# Patient Record
Sex: Male | Born: 1965 | Race: Asian | Hispanic: No | Marital: Married | Smoking: Never smoker
Health system: Southern US, Community
[De-identification: ages and names within clinical notes are randomized; demographics above are authoritative.]

## PROBLEM LIST (undated history)

## (undated) DIAGNOSIS — K769 Liver disease, unspecified: Secondary | ICD-10-CM

## (undated) HISTORY — PX: LIVER SURGERY: SHX698

---

## 2005-05-24 ENCOUNTER — Encounter: Admission: RE | Admit: 2005-05-24 | Discharge: 2005-08-22 | Payer: Self-pay | Admitting: Internal Medicine

## 2006-01-29 ENCOUNTER — Encounter: Admission: RE | Admit: 2006-01-29 | Discharge: 2006-01-29 | Payer: Self-pay | Admitting: Internal Medicine

## 2007-09-28 ENCOUNTER — Emergency Department (HOSPITAL_COMMUNITY): Admission: EM | Admit: 2007-09-28 | Discharge: 2007-09-29 | Payer: Self-pay | Admitting: Emergency Medicine

## 2007-10-31 ENCOUNTER — Ambulatory Visit: Payer: Self-pay | Admitting: Family Medicine

## 2007-11-26 ENCOUNTER — Ambulatory Visit: Payer: Self-pay | Admitting: Internal Medicine

## 2007-11-26 LAB — CONVERTED CEMR LAB
AST: 21 units/L (ref 0–37)
Albumin: 4.5 g/dL (ref 3.5–5.2)
Alkaline Phosphatase: 135 units/L — ABNORMAL HIGH (ref 39–117)
CO2: 22 meq/L (ref 19–32)
Calcium, Total (PTH): 9.3 mg/dL (ref 8.4–10.5)
Creatinine, Ser: 0.99 mg/dL (ref 0.40–1.50)
Eosinophils Relative: 4 % (ref 0–5)
Glucose, Bld: 110 mg/dL — ABNORMAL HIGH (ref 70–99)
HCT: 47.7 % (ref 39.0–52.0)
Lymphocytes Relative: 34 % (ref 12–46)
Lymphs Abs: 2.2 10*3/uL (ref 0.7–4.0)
MCHC: 34 g/dL (ref 30.0–36.0)
MCV: 87.2 fL (ref 78.0–100.0)
Monocytes Absolute: 0.6 10*3/uL (ref 0.1–1.0)
Neutro Abs: 3.4 10*3/uL (ref 1.7–7.7)
Platelets: 200 10*3/uL (ref 150–400)
RBC: 5.47 M/uL (ref 4.22–5.81)
RDW: 14.8 % (ref 11.5–15.5)
Sodium: 141 meq/L (ref 135–145)
TSH: 2.804 microintl units/mL (ref 0.350–5.50)
Total Protein: 7.6 g/dL (ref 6.0–8.3)
WBC: 6.6 10*3/uL (ref 4.0–10.5)

## 2007-12-19 ENCOUNTER — Ambulatory Visit: Payer: Self-pay | Admitting: Family Medicine

## 2007-12-22 ENCOUNTER — Ambulatory Visit: Payer: Self-pay | Admitting: *Deleted

## 2008-10-20 ENCOUNTER — Ambulatory Visit: Payer: Self-pay | Admitting: Family Medicine

## 2009-05-20 ENCOUNTER — Ambulatory Visit: Payer: Self-pay | Admitting: Internal Medicine

## 2011-07-13 LAB — BASIC METABOLIC PANEL
Calcium: 9.3
Potassium: 3.5

## 2011-07-13 LAB — URINALYSIS, ROUTINE W REFLEX MICROSCOPIC
Bilirubin Urine: NEGATIVE
Glucose, UA: NEGATIVE
Ketones, ur: 15 — AB
Protein, ur: NEGATIVE

## 2011-07-13 LAB — DIFFERENTIAL
Eosinophils Relative: 0
Lymphocytes Relative: 9 — ABNORMAL LOW
Neutro Abs: 11.6 — ABNORMAL HIGH

## 2011-07-13 LAB — URINE MICROSCOPIC-ADD ON

## 2011-07-13 LAB — CBC
HCT: 43.6
Hemoglobin: 15
MCV: 84.7
Platelets: 230

## 2014-11-08 ENCOUNTER — Emergency Department (HOSPITAL_BASED_OUTPATIENT_CLINIC_OR_DEPARTMENT_OTHER)
Admission: EM | Admit: 2014-11-08 | Discharge: 2014-11-08 | Disposition: A | Discharge status: Home / Self Care | Payer: Medicaid Other | Attending: Emergency Medicine | Admitting: Emergency Medicine

## 2014-11-08 ENCOUNTER — Emergency Department (HOSPITAL_BASED_OUTPATIENT_CLINIC_OR_DEPARTMENT_OTHER): Payer: Medicaid Other

## 2014-11-08 ENCOUNTER — Encounter (HOSPITAL_BASED_OUTPATIENT_CLINIC_OR_DEPARTMENT_OTHER): Payer: Self-pay | Admitting: *Deleted

## 2014-11-08 DIAGNOSIS — R1084 Generalized abdominal pain: Secondary | ICD-10-CM | POA: Insufficient documentation

## 2014-11-08 DIAGNOSIS — R112 Nausea with vomiting, unspecified: Secondary | ICD-10-CM | POA: Insufficient documentation

## 2014-11-08 DIAGNOSIS — R079 Chest pain, unspecified: Secondary | ICD-10-CM | POA: Diagnosis present

## 2014-11-08 DIAGNOSIS — Z880 Allergy status to penicillin: Secondary | ICD-10-CM | POA: Insufficient documentation

## 2014-11-08 DIAGNOSIS — Z8719 Personal history of other diseases of the digestive system: Secondary | ICD-10-CM | POA: Insufficient documentation

## 2014-11-08 DIAGNOSIS — R5383 Other fatigue: Secondary | ICD-10-CM | POA: Insufficient documentation

## 2014-11-08 HISTORY — DX: Liver disease, unspecified: K76.9

## 2014-11-08 LAB — COMPREHENSIVE METABOLIC PANEL
ALK PHOS: 225 U/L — AB (ref 39–117)
ALT: 40 U/L (ref 0–53)
AST: 33 U/L (ref 0–37)
Albumin: 3.3 g/dL — ABNORMAL LOW (ref 3.5–5.2)
Anion gap: 4 — ABNORMAL LOW (ref 5–15)
BILIRUBIN TOTAL: 2.4 mg/dL — AB (ref 0.3–1.2)
BUN: 16 mg/dL (ref 6–23)
CHLORIDE: 109 mmol/L (ref 96–112)
CO2: 25 mmol/L (ref 19–32)
Calcium: 8.7 mg/dL (ref 8.4–10.5)
Creatinine, Ser: 0.87 mg/dL (ref 0.50–1.35)
GFR calc Af Amer: >90 mL/min (ref >90)
Glucose, Bld: 92 mg/dL (ref 70–99)
Potassium: 3.3 mmol/L — ABNORMAL LOW (ref 3.5–5.1)
Sodium: 138 mmol/L (ref 135–145)
Total Protein: 8 g/dL (ref 6.0–8.3)

## 2014-11-08 LAB — CBC WITH DIFFERENTIAL/PLATELET
BASOS ABS: 0.1 10*3/uL (ref 0.0–0.1)
Basophils Relative: 1 % (ref 0–1)
EOS ABS: 0.3 10*3/uL (ref 0.0–0.7)
EOS PCT: 2 % (ref 0–5)
HEMATOCRIT: 39.5 % (ref 39.0–52.0)
HEMOGLOBIN: 13 g/dL (ref 13.0–17.0)
LYMPHS PCT: 18 % (ref 12–46)
Lymphs Abs: 2 10*3/uL (ref 0.7–4.0)
MCH: 28.6 pg (ref 26.0–34.0)
MCHC: 32.9 g/dL (ref 30.0–36.0)
MCV: 86.8 fL (ref 78.0–100.0)
Monocytes Absolute: 1.3 10*3/uL — ABNORMAL HIGH (ref 0.1–1.0)
Monocytes Relative: 12 % (ref 3–12)
NEUTROS ABS: 7.7 10*3/uL (ref 1.7–7.7)
Neutrophils Relative %: 67 % (ref 43–77)
Platelets: 396 10*3/uL (ref 150–400)
RBC: 4.55 MIL/uL (ref 4.22–5.81)
RDW: 13.9 % (ref 11.5–15.5)
WBC: 11.4 10*3/uL — ABNORMAL HIGH (ref 4.0–10.5)

## 2014-11-08 LAB — URINALYSIS, ROUTINE W REFLEX MICROSCOPIC
GLUCOSE, UA: NEGATIVE mg/dL
HGB URINE DIPSTICK: NEGATIVE
Ketones, ur: NEGATIVE mg/dL
Leukocytes, UA: NEGATIVE
Nitrite: NEGATIVE
Protein, ur: NEGATIVE mg/dL
Specific Gravity, Urine: 1.013 (ref 1.005–1.030)
UROBILINOGEN UA: 0.2 mg/dL (ref 0.0–1.0)
pH: 5.5 (ref 5.0–8.0)

## 2014-11-08 LAB — TROPONIN I: Troponin I: <0.03 ng/mL (ref <0.031)

## 2014-11-08 LAB — LIPASE, BLOOD: LIPASE: 40 U/L (ref 11–59)

## 2014-11-08 MED ORDER — OMEPRAZOLE 20 MG PO CPDR: 20.0000 mg | DELAYED_RELEASE_CAPSULE | Freq: Every day | ORAL | Status: DC

## 2014-11-08 MED ORDER — SUCRALFATE 1 G PO TABS: 1.0000 g | ORAL_TABLET | Freq: Three times a day (TID) | ORAL | Status: DC

## 2014-11-08 NOTE — ED Notes (Signed)
Pt c/o abd pain yesterday and today started having generalized chest pain and bilateral shoulder pain  Increased w inspiration, denies cough, no fever, no n/v  Chills at times

## 2014-11-08 NOTE — ED Provider Notes (Signed)
CSN: [id number]     Arrival date & time 11/08/14  1835 History   First MD Initiated Contact with Patient 11/08/14 1930     Chief Complaint  Patient presents with  . Chest Pain     (Consider location/radiation/quality/duration/timing/severity/associated sxs/prior Treatment) HPI Comments: Patient with history of narrow bile ducts, status post bile duct stenting -- presents with complaint of upper abdominal pain with radiation of achiness and soreness into his chest, shoulders, and upper extremities bilaterally. It is not reproducible and does not have any association to food or activity. Patient states he has had similar symptoms to this 3-4 times over the past 10 years. Each time he took over-the-counter medications and rested and the symptoms resolved.   Patient had nausea, vomiting, and fever 3 weeks ago while visiting [place]. He was told that he had an infection by urine/blood testing but is no more specific than this. He was seen by a doctor and prescribed an antibiotic for 7 days which he took and his symptoms improved. Patient returned to the [place] 2 weeks ago. He continued to have anorexia and generalized fatigue but was feeling better overall. Yesterday he developed upper abdominal soreness without nausea or vomiting. No diarrhea. Today this pain and soreness moved up to chest as described above. He does not have cough or shortness of breath. No hemoptysis. No history of blood clot. He does not have any swelling or soreness in his lower extremities. Patient did have a long plane flight 2 weeks ago when he returned home. No treatments PTA.   Patient does say that he has had episodes of "liver infection" in the past with associated elevated bilirubin and upper quadrant tenderness. Patient states that this feels different than his previous symptoms attributed to this infection.   Regarding cardiac risk factors: + family history, otherwise none. No history of stress testing or cath.   The history  is provided by the patient.    Past Medical History  Diagnosis Date  . Liver disease    Past Surgical History  Procedure Laterality Date  . Liver surgery     No family history on file. History  Substance Use Topics  . Smoking status: Never Smoker   . Smokeless tobacco: Not on file  . Alcohol Use: No    Review of Systems  Constitutional: Positive for appetite change and fatigue. Negative for fever and diaphoresis.  HENT: Negative for rhinorrhea and sore throat.   Eyes: Negative for redness.  Respiratory: Negative for cough, shortness of breath and wheezing.   Cardiovascular: Positive for chest pain. Negative for palpitations and leg swelling.  Gastrointestinal: Positive for nausea and abdominal pain. Negative for vomiting, diarrhea, constipation and blood in stool.  Genitourinary: Negative for dysuria.  Musculoskeletal: Negative for myalgias, back pain and neck pain.  Skin: Negative for rash.  Neurological: Negative for syncope, light-headedness and headaches.    Allergies  Penicillins  Home Medications   Prior to Admission medications   Medication Sig Start Date End Date Taking? Authorizing Provider  Cholestyramine (QUESTRAN PO) Take by mouth.   Yes Historical Provider, MD   BP 135/83 mmHg  Pulse 92  Temp(Src) 98.9 F (37.2 C) (Oral)  Resp 20  Ht  (1.651 m)  Wt 150 lb (68.04 kg)  BMI 24.96 kg/m2  SpO2 97%   Physical Exam  Constitutional: He appears well-developed and well-nourished.  HENT:  Head: Normocephalic and atraumatic.  Mouth/Throat: Oropharynx is clear and moist.  Eyes: Conjunctivae are normal. Pupils  are equal, round, and reactive to light. Right eye exhibits no discharge. Left eye exhibits no discharge.  No obvious icterus.  Neck: Normal range of motion. Neck supple. No JVD present.  Cardiovascular: Normal rate, regular rhythm and normal heart sounds.   No murmur heard. Pulmonary/Chest: Effort normal and breath sounds normal. No respiratory  distress. He has no wheezes. He has no rales.  Abdominal: Soft. There is no tenderness. There is no rebound and no guarding.  Neurological: He is alert.  Skin: Skin is warm and dry.  Psychiatric: He has a normal mood and affect.  Nursing note and vitals reviewed.   ED Course  Procedures (including critical care time) Labs Review Labs Reviewed  COMPREHENSIVE METABOLIC PANEL - Abnormal; Notable for the following:    Potassium 3.3 (*)    Albumin 3.3 (*)    Alkaline Phosphatase 225 (*)    Total Bilirubin 2.4 (*)    Anion gap 4 (*)    All other components within normal limits  CBC WITH DIFFERENTIAL/PLATELET - Abnormal; Notable for the following:    WBC 11.4 (*)    Monocytes Absolute 1.3 (*)    All other components within normal limits  URINALYSIS, ROUTINE W REFLEX MICROSCOPIC - Abnormal; Notable for the following:    Color, Urine AMBER (*)    Bilirubin Urine SMALL (*)    All other components within normal limits  LIPASE, BLOOD  TROPONIN I    Imaging Review Dg Chest 2 View  11/08/2014   CLINICAL DATA:  Acute onset of upper chest pain and chills. Initial encounter.  EXAM: CHEST  2 VIEW  COMPARISON:  None.  FINDINGS: The lungs are well-aerated and clear. There is no evidence of focal opacification, pleural effusion or pneumothorax.  The heart is normal in size; the mediastinal contour is within normal limits. No acute osseous abnormalities are seen.  IMPRESSION: No acute cardiopulmonary process seen.   Electronically Signed   By: Roanna RaiderJeffery  Chang M.D.   On: 11/08/2014 20:27     EKG Interpretation   Date/Time:  Monday November 08 2014 18:41:23 EST Ventricular Rate:  88 PR Interval:  152 QRS Duration: 82 QT Interval:  352 QTC Calculation: 425 R Axis:   36 Text Interpretation:  Normal sinus rhythm Low voltage QRS Borderline ECG  AGREE. NO STEMI. Confirmed by Donnald GarrePfeiffer, MD, Lebron ConnersMarcy [phone]([id number]) on 11/08/2014  7:35:50 PM       7:52 PM Patient seen and examined. Work-up initiated.    Vital  signs reviewed and are as follows: BP 135/83 mmHg  Pulse 92  Temp(Src) 98.9 F (37.2 C) (Oral)  Resp 20  Ht 5\' 5"  (1.651 m)  Wt 150 lb (68.04 kg)  BMI 24.96 kg/m2  SpO2 97%  Patient discussed with Dr. Donnald GarrePfeiffer. We discussed CT scan of abd/pelvis and it's limitations. We do not really know what we would be looking for. Labs, EKG, and imaging appear baseline.    Discussed with patient. He is feeling better over the course of the ED stay. Abdomen remains soft and non-tender.   We discussed that we do not know what we would be looking for with further testing at this time. We discussed that work-up here is reassuring. Offered CT with these limitation to eval liver and abdomen. Patient feels well and wishes to defer further testing to PCP. He has PCP and states he will follow-up. He voices interest in re-establishing care with a hepatologist which I think is wise. [place] physician moved and patient was lost to hepatology follow-up.   In the meantime will start on empiric therapy for gastritis with PPI/carafate.  Patient was counseled to return with severe chest pain, especially if the pain is crushing or pressure-like and spreads to the arms, back, neck, or jaw, or if they have sweating, nausea, or shortness of breath with the pain. They were encouraged to call 911 with these symptoms.   They were also told to return if their chest pain gets worse and does not go away with rest, they have an attack of chest pain lasting longer than usual despite rest and treatment with the medications their caregiver has prescribed, if they wake from sleep with chest pain or shortness of breath, if they feel dizzy or faint, if they have chest pain not typical of their usual pain, or if they have any other emergent concerns regarding their health.  The patient verbalized understanding and agreed.   The patient was urged to return to the Emergency Department immediately with  worsening of current symptoms, worsening abdominal pain, persistent vomiting, blood noted in stools, fever, or any other concerns. The patient verbalized understanding.    MDM   Final diagnoses:  Generalized abdominal pain  Chest pain, unspecified chest pain type   Patient with unusual abdomen pain with radiation into chest, with history of biliary stenting. Recent illness as prescribed, but currently no fever or significant reproducible abdominal pain here. Patient appears comfortable. His labs are reassuring. Patient states that he has had fluctuating total bili in past. The pain that pt reports is upper abd but does not localize to either side.   Patient's main concern tonight is his heart. He has no exertional symptoms. HEART = 2. EKG is unremarkable, no ischemic change. Troponin is negative. Very low suspicion for ACS. Patient agreeable to follow-up for consideration of stress testing.   Patient had recent plane flight but no other symptoms of PE. No SOB. Vitals WNL. Very low suspicion for this and do not feel that d-dimer or CT angio indicated at this time.   Regarding abdomen, no focal tenderness or tenderness to palpation on exam. No fever. Considered CT but opted not to at this time given mild symptoms and shared decision making with patient. He appears well.  No signs of dehydration, tolerating PO's. Lungs are clear. No focal abdominal pain, doubt appendicitis, cholecystitis, pancreatitis, ruptured viscus, UTI, kidney stone, or any other immediately life-threatening abdominal etiology. Supportive therapy indicated with return if symptoms worsen. Patient counseled.     Renne Crigler, PA-C 11/11/14 0015  Arby Barrette, MD 11/14/14 [phone]

## 2014-11-08 NOTE — Discharge Instructions (Signed)
Please read and follow all provided instructions.  Your diagnoses today include:  1. Generalized abdominal pain   2. Chest pain, unspecified chest pain type     Tests performed today include:  An EKG of your heart - no problems  A chest x-ray - normal  Cardiac enzymes - a blood test for heart muscle damage  Blood counts and electrolytes  Vital signs. See below for your results today.   Medications prescribed:   Omeprazole (Prilosec) - stomach acid reducer  This medication can be found over-the-counter   Carafate - for stomach upset and to protect your stomach  Take any prescribed medications only as directed.  Follow-up instructions: Please follow-up with your primary care provider as soon as you can for further evaluation of your symptoms.   Return instructions:  SEEK IMMEDIATE MEDICAL ATTENTION IF:  You have severe chest pain, especially if the pain is crushing or pressure-like and spreads to the arms, back, neck, or jaw, or if you have sweating, nausea (feeling sick to your stomach), or shortness of breath. THIS IS AN EMERGENCY. Don't wait to see if the pain will go away. Get medical help at once. Call 911 or [phone] (operator). DO NOT drive yourself to the hospital.   Your chest pain gets worse and does not go away with rest.   You have an attack of chest pain lasting longer than usual, despite rest and treatment with the medications your caregiver has prescribed.   You wake from sleep with chest pain or shortness of breath.  You feel dizzy or faint.  You have chest pain not typical of your usual pain for which you originally saw your caregiver.   You have any other emergent concerns regarding your health.  Additional Information: Chest pain comes from many different causes. Your caregiver has diagnosed you as having chest pain that is not specific for one problem, but does not require admission.  You are at low risk for an acute heart condition or other serious  illness.   Abdominal (belly) pain can be caused by many things. Your caregiver performed an examination and possibly ordered blood/urine tests and imaging (CT scan, x-rays, ultrasound). Many cases can be observed and treated at home after initial evaluation in the emergency department. Even though you are being discharged home, abdominal pain can be unpredictable. Therefore, you need a repeated exam if your pain does not resolve, returns, or worsens. Most patients with abdominal pain don't have to be admitted to the hospital or have surgery, but serious problems like appendicitis and gallbladder attacks can start out as nonspecific pain. Many abdominal conditions cannot be diagnosed in one visit, so follow-up evaluations are very important.  Your vital signs today were: BP 102/68 mmHg   Pulse 80   Temp(Src) 98.9 F (37.2 C) (Oral)   Resp 18   Ht 5\' 5"  (1.651 m)   Wt 150 lb (68.04 kg)   BMI 24.96 kg/m2   SpO2 99% If your blood pressure (BP) was elevated above 135/85 this visit, please have this repeated by your doctor within one month.

## 2014-11-08 NOTE — ED Notes (Addendum)
Chest pain today. States it started in his abdomen and now it has gone into his chest. Hx of chills, fever. He was being treated with antibiotics in [place] 2 weeks ago for urine and blood infection.

## 2016-10-03 ENCOUNTER — Emergency Department (HOSPITAL_BASED_OUTPATIENT_CLINIC_OR_DEPARTMENT_OTHER)
Admission: EM | Admit: 2016-10-03 | Discharge: 2016-10-03 | Disposition: A | Discharge status: Home / Self Care | Payer: Medicaid Other | Attending: Dermatology | Admitting: Dermatology

## 2016-10-03 ENCOUNTER — Encounter (HOSPITAL_BASED_OUTPATIENT_CLINIC_OR_DEPARTMENT_OTHER): Payer: Self-pay | Admitting: *Deleted

## 2016-10-03 DIAGNOSIS — J029 Acute pharyngitis, unspecified: Secondary | ICD-10-CM | POA: Insufficient documentation

## 2016-10-03 DIAGNOSIS — Z5321 Procedure and treatment not carried out due to patient leaving prior to being seen by health care provider: Secondary | ICD-10-CM | POA: Insufficient documentation

## 2016-10-03 NOTE — ED Triage Notes (Signed)
Pt c/o sore throat x 1 week kids at home with strep

## 2016-10-03 NOTE — ED Notes (Signed)
Patient stated that he had to leave unless we could take him and his wife back immediately. Informed him that we would get them to a room as soon as possible, but could not take them back immediately. They stated that they may come back later tonight. Patient alert and orient VSS.

## 2019-06-11 ENCOUNTER — Emergency Department (HOSPITAL_BASED_OUTPATIENT_CLINIC_OR_DEPARTMENT_OTHER)
Admission: EM | Admit: 2019-06-11 | Discharge: 2019-06-11 | Disposition: A | Discharge status: Home / Self Care | Payer: Medicaid Other | Source: Home / Self Care | Attending: Emergency Medicine | Admitting: Emergency Medicine

## 2019-06-11 ENCOUNTER — Emergency Department (HOSPITAL_BASED_OUTPATIENT_CLINIC_OR_DEPARTMENT_OTHER): Payer: Medicaid Other

## 2019-06-11 ENCOUNTER — Encounter (HOSPITAL_BASED_OUTPATIENT_CLINIC_OR_DEPARTMENT_OTHER): Payer: Self-pay | Admitting: Emergency Medicine

## 2019-06-11 ENCOUNTER — Other Ambulatory Visit: Payer: Self-pay

## 2019-06-11 DIAGNOSIS — K769 Liver disease, unspecified: Secondary | ICD-10-CM | POA: Insufficient documentation

## 2019-06-11 DIAGNOSIS — A419 Sepsis, unspecified organism: Secondary | ICD-10-CM | POA: Insufficient documentation

## 2019-06-11 DIAGNOSIS — Z20828 Contact with and (suspected) exposure to other viral communicable diseases: Secondary | ICD-10-CM | POA: Insufficient documentation

## 2019-06-11 LAB — COMPREHENSIVE METABOLIC PANEL
ALT: 28 U/L (ref 0–44)
AST: 23 U/L (ref 15–41)
Albumin: 4.2 g/dL (ref 3.5–5.0)
Alkaline Phosphatase: 116 U/L (ref 38–126)
Anion gap: 9 (ref 5–15)
BUN: 13 mg/dL (ref 6–20)
CO2: 22 mmol/L (ref 22–32)
Calcium: 8.5 mg/dL — ABNORMAL LOW (ref 8.9–10.3)
Chloride: 100 mmol/L (ref 98–111)
Creatinine, Ser: 1.04 mg/dL (ref 0.61–1.24)
GFR calc Af Amer: >60 mL/min (ref >60)
GFR calc non Af Amer: >60 mL/min (ref >60)
Glucose, Bld: 171 mg/dL — ABNORMAL HIGH (ref 70–99)
Potassium: 3.7 mmol/L (ref 3.5–5.1)
Sodium: 131 mmol/L — ABNORMAL LOW (ref 135–145)
Total Bilirubin: 1.6 mg/dL — ABNORMAL HIGH (ref 0.3–1.2)
Total Protein: 8.3 g/dL — ABNORMAL HIGH (ref 6.5–8.1)

## 2019-06-11 LAB — CBC WITH DIFFERENTIAL/PLATELET
Abs Immature Granulocytes: 0.05 10*3/uL (ref 0.00–0.07)
Basophils Absolute: 0.1 10*3/uL (ref 0.0–0.1)
Basophils Relative: 0 %
Eosinophils Absolute: 0 10*3/uL (ref 0.0–0.5)
Eosinophils Relative: 0 %
HCT: 48.4 % (ref 39.0–52.0)
Hemoglobin: 16.3 g/dL (ref 13.0–17.0)
Immature Granulocytes: 0 %
Lymphocytes Relative: 8 %
Lymphs Abs: 1 10*3/uL (ref 0.7–4.0)
MCH: 29.9 pg (ref 26.0–34.0)
MCHC: 33.7 g/dL (ref 30.0–36.0)
MCV: 88.6 fL (ref 80.0–100.0)
Monocytes Absolute: 1.5 10*3/uL — ABNORMAL HIGH (ref 0.1–1.0)
Monocytes Relative: 12 %
Neutro Abs: 9.9 10*3/uL — ABNORMAL HIGH (ref 1.7–7.7)
Neutrophils Relative %: 80 %
Platelets: 204 10*3/uL (ref 150–400)
RBC: 5.46 MIL/uL (ref 4.22–5.81)
RDW: 12.8 % (ref 11.5–15.5)
WBC: 12.6 10*3/uL — ABNORMAL HIGH (ref 4.0–10.5)
nRBC: 0 % (ref 0.0–0.2)

## 2019-06-11 LAB — URINALYSIS, MICROSCOPIC (REFLEX)

## 2019-06-11 LAB — URINALYSIS, ROUTINE W REFLEX MICROSCOPIC
Bilirubin Urine: NEGATIVE
Glucose, UA: >=500 mg/dL — AB
Hgb urine dipstick: NEGATIVE
Ketones, ur: NEGATIVE mg/dL
Leukocytes,Ua: NEGATIVE
Nitrite: NEGATIVE
Protein, ur: NEGATIVE mg/dL
Specific Gravity, Urine: 1.015 (ref 1.005–1.030)
pH: 7 (ref 5.0–8.0)

## 2019-06-11 LAB — PROTIME-INR
INR: 1 (ref 0.8–1.2)
Prothrombin Time: 13.5 seconds (ref 11.4–15.2)

## 2019-06-11 LAB — LACTIC ACID, PLASMA
Lactic Acid, Venous: 1.7 mmol/L (ref 0.5–1.9)
Lactic Acid, Venous: 2 mmol/L (ref 0.5–1.9)

## 2019-06-11 LAB — LIPASE, BLOOD: Lipase: 31 U/L (ref 11–51)

## 2019-06-11 LAB — SARS CORONAVIRUS 2 BY RT PCR (HOSPITAL ORDER, PERFORMED IN ~~LOC~~ HOSPITAL LAB): SARS Coronavirus 2: NEGATIVE

## 2019-06-11 LAB — APTT: aPTT: 32 seconds (ref 24–36)

## 2019-06-11 MED ORDER — AZTREONAM 1 G IJ SOLR
INTRAMUSCULAR | Status: AC
  Filled 2019-06-11: qty 2

## 2019-06-11 MED ORDER — SODIUM CHLORIDE 0.9 % IV SOLN
2.0000 g | Freq: Once | INTRAVENOUS | Status: AC
  Administered 2019-06-11: 2 g via INTRAVENOUS
  Filled 2019-06-11: qty 2

## 2019-06-11 MED ORDER — METRONIDAZOLE IN NACL 5-0.79 MG/ML-% IV SOLN
500.0000 mg | Freq: Once | INTRAVENOUS | Status: AC
  Administered 2019-06-11: 500 mg via INTRAVENOUS
  Filled 2019-06-11: qty 100

## 2019-06-11 MED ORDER — IOHEXOL 300 MG/ML  SOLN
100.0000 mL | Freq: Once | INTRAMUSCULAR | Status: AC | PRN
  Administered 2019-06-11: 18:00:00 100 mL via INTRAVENOUS

## 2019-06-11 MED ORDER — SODIUM CHLORIDE 0.9 % IV BOLUS
500.0000 mL | Freq: Once | INTRAVENOUS | Status: AC
  Administered 2019-06-11: 500 mL via INTRAVENOUS

## 2019-06-11 MED ORDER — VANCOMYCIN HCL IN DEXTROSE 1-5 GM/200ML-% IV SOLN
1000.0000 mg | Freq: Once | INTRAVENOUS | Status: AC
  Administered 2019-06-11: 16:00:00 1000 mg via INTRAVENOUS
  Filled 2019-06-11: qty 200

## 2019-06-11 MED ORDER — SODIUM CHLORIDE 0.9 % IV SOLN
2.0000 g | Freq: Three times a day (TID) | INTRAVENOUS | Status: DC
  Filled 2019-06-11: qty 2

## 2019-06-11 MED ORDER — ONDANSETRON HCL 4 MG/2ML IJ SOLN
4.0000 mg | Freq: Once | INTRAMUSCULAR | Status: AC
  Administered 2019-06-11: 16:00:00 4 mg via INTRAVENOUS
  Filled 2019-06-11: qty 2

## 2019-06-11 MED ORDER — SODIUM CHLORIDE 0.9 % IV BOLUS
1000.0000 mL | Freq: Once | INTRAVENOUS | Status: AC
  Administered 2019-06-11: 19:00:00 1000 mL via INTRAVENOUS

## 2019-06-11 MED ORDER — ACETAMINOPHEN 500 MG PO TABS
ORAL_TABLET | ORAL | Status: AC
  Administered 2019-06-11: 1000 mg via ORAL
  Filled 2019-06-11: qty 2

## 2019-06-11 MED ORDER — VANCOMYCIN HCL IN DEXTROSE 1-5 GM/200ML-% IV SOLN: 1000.0000 mg | Freq: Two times a day (BID) | INTRAVENOUS | Status: DC

## 2019-06-11 MED ORDER — ACETAMINOPHEN 325 MG PO TABS
650.0000 mg | ORAL_TABLET | Freq: Once | ORAL | Status: AC
  Administered 2019-06-11: 16:00:00 1000 mg via ORAL

## 2019-06-11 MED ORDER — SODIUM CHLORIDE 0.9 % IV SOLN
INTRAVENOUS | Status: DC
  Administered 2019-06-11: 20:00:00 via INTRAVENOUS

## 2019-06-11 NOTE — ED Provider Notes (Addendum)
[facility] EMERGENCY DEPARTMENT Provider Note   CSN: [id number] Arrival date & time: 06/11/19  1509     History   Chief Complaint Chief Complaint  Patient presents with   Abdominal Pain    HPI Alejandro Macdonald is a 53 y.o. male.     Patient presenting with a one-week complaint of abdominal pain lower abdominal pain and fever.  Started with chills and fever got worse in the last 24 hours.  Vomited once today.  No diarrhea.  No upper respiratory infections.  Patient was treating himself with some Flagyl at home that he had leftover.  Patient's past medical history is significant for liver disease.  Looks like [facility].  Patient is never smoked.  Patient states he never drank.  So the cause of the cirrhosis is not known.     Past Medical History:  Diagnosis Date   Liver disease     There are no active problems to display for this patient.   Past Surgical History:  Procedure Laterality Date   LIVER SURGERY          Home Medications    Prior to Admission medications   Not on File    Family History No family history on file.  Social History Social History   Tobacco Use   Smoking status: Never Smoker   Smokeless tobacco: Never Used  Substance Use Topics   Alcohol use: No   Drug use: No     Allergies   Penicillins   Review of Systems Review of Systems  Constitutional: Positive for chills and fever.  HENT: Negative for congestion, rhinorrhea and sore throat.   Eyes: Negative for visual disturbance.  Respiratory: Negative for cough and shortness of breath.   Cardiovascular: Negative for chest pain and leg swelling.  Gastrointestinal: Positive for abdominal pain, nausea and vomiting. Negative for diarrhea.  Genitourinary: Negative for dysuria.  Musculoskeletal: Negative for back pain and neck pain.  Skin: Negative for rash.  Neurological: Negative for dizziness, light-headedness  and headaches.  Hematological: Does not bruise/bleed easily.  Psychiatric/Behavioral: Negative for confusion.     Physical Exam Updated Vital Signs BP 114/71    Pulse 95    Temp (!) 101.3 F (38.5 C) (Oral)    Resp (!) 26    Ht 1.651 m (5\' 5" )    Wt 68 kg    SpO2 97%    BMI 24.96 kg/m   Physical Exam Vitals signs and nursing note reviewed.  Constitutional:      General: He is in acute distress.     Appearance: He is well-developed. He is ill-appearing.  HENT:     Head: Normocephalic and atraumatic.  Eyes:     Extraocular Movements: Extraocular movements intact.     Conjunctiva/sclera: Conjunctivae normal.     Pupils: Pupils are equal, round, and reactive to light.  Neck:     Musculoskeletal: Neck supple.  Cardiovascular:     Rate and Rhythm: Normal rate and regular rhythm.     Heart sounds: No murmur.  Pulmonary:     Effort: Pulmonary effort is normal. No respiratory distress.     Breath sounds: Normal breath sounds.  Abdominal:     Palpations: Abdomen is soft.     Tenderness: There is no abdominal tenderness.  Musculoskeletal: Normal range of motion.        General: No swelling.  Skin:    General: Skin is warm and  dry.  Neurological:     General: No focal deficit present.     Mental Status: He is alert and oriented to person, place, and time.      ED Treatments / Results  Labs (all labs ordered are listed, but only abnormal results are displayed) Labs Reviewed  CBC WITH DIFFERENTIAL/PLATELET - Abnormal; Notable for the following components:      Result Value   WBC 12.6 (*)    Neutro Abs 9.9 (*)    Monocytes Absolute 1.5 (*)    All other components within normal limits  COMPREHENSIVE METABOLIC PANEL - Abnormal; Notable for the following components:   Sodium 131 (*)    Glucose, Bld 171 (*)    Calcium 8.5 (*)    Total Protein 8.3 (*)    Total Bilirubin 1.6 (*)    All other components within normal limits  URINALYSIS, ROUTINE W REFLEX MICROSCOPIC - Abnormal;  Notable for the following components:   Glucose, UA >=500 (*)    All other components within normal limits  LACTIC ACID, PLASMA - Abnormal; Notable for the following components:   Lactic Acid, Venous 2.0 (*)    All other components within normal limits  URINALYSIS, MICROSCOPIC (REFLEX) - Abnormal; Notable for the following components:   Bacteria, UA FEW (*)    All other components within normal limits  SARS CORONAVIRUS 2 (HOSPITAL ORDER, [facility])  CULTURE, BLOOD (ROUTINE X 2)  CULTURE, BLOOD (ROUTINE X 2)  URINE CULTURE  LIPASE, BLOOD  LACTIC ACID, PLASMA  APTT  PROTIME-INR    EKG None   ED ECG REPORT   Date: 06/11/2019  Rate: 103  Rhythm: sinus tachycardia  QRS Axis: normal  Intervals: normal  ST/T Wave abnormalities: normal  Conduction Disutrbances:none  Narrative Interpretation:   Old EKG Reviewed: none available  I have personally reviewed the EKG tracing and agree with the computerized printout as noted.   Radiology Ct Abdomen Pelvis W Contrast  Result Date: 06/11/2019 CLINICAL DATA:  Abdominal pain with diverticulitis suspected. EXAM: CT ABDOMEN AND PELVIS WITH CONTRAST TECHNIQUE: Multidetector CT imaging of the abdomen and pelvis was performed using the standard protocol following bolus administration of intravenous contrast. CONTRAST:  156mL OMNIPAQUE IOHEXOL 300 MG/ML  SOLN COMPARISON:  September 29, 2007. FINDINGS: Lower chest: The lung bases are clear. The heart size is normal. Hepatobiliary: There is intrahepatic biliary ductal dilatation involving the left hepatic lobe. This appears similar to prior CT in 2008. There is persistent pneumobilia, similar to prior CT. Pancreas: Normal contours without ductal dilatation. No peripancreatic fluid collection. Spleen: No splenic laceration or hematoma. Adrenals/Urinary Tract: --Adrenal glands: No adrenal hemorrhage. --Right kidney/ureter: No hydronephrosis or perinephric hematoma. --Left  kidney/ureter: No hydronephrosis or perinephric hematoma. --Urinary bladder: There is some mild bladder wall thickening. Stomach/Bowel: --Stomach/Duodenum: No hiatal hernia or other gastric abnormality. Normal duodenal course and caliber. 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 --Colon: No focal abnormality. --Appendix: Normal. Vascular/Lymphatic: Normal course and caliber of the major abdominal vessels. --No retroperitoneal lymphadenopathy. --No mesenteric lymphadenopathy. --No pelvic or inguinal lymphadenopathy. Reproductive: Unremarkable Other: No ascites or free air. 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 Musculoskeletal. No acute displaced fractures. IMPRESSION: 1. No acute abdominopelvic process. 2. Mild bladder wall thickening. Correlate with urinalysis for possible cystitis. 3. Additional chronic findings as detailed above. Electronically Signed   By: Constance Holster M.D.   On: 06/11/2019 18:30   Dg Chest [facility] 1 [address]  Result Date: 06/11/2019 CLINICAL DATA:  53 year old with one-week history of fever and abdominal pain. Nausea and vomiting that started today. EXAM: PORTABLE CHEST 1 VIEW COMPARISON:  11/08/2014. FINDINGS: Cardiac silhouette normal in size, unchanged. Thoracic aorta minimally tortuous. Hilar and mediastinal contours otherwise unremarkable. Lungs clear. Bronchovascular markings normal. Pulmonary vascularity normal. No visible pleural effusions. No pneumothorax. IMPRESSION: No acute cardiopulmonary disease. Electronically Signed   By: Hulan Saas M.D.   On: 06/11/2019 16:15    Procedures Procedures (including critical care time)  CRITICAL CARE Performed by: Vanetta Mulders Total critical care time: 30 minutes Critical care time was exclusive of separately billable procedures and treating other patients. Critical care was necessary to treat or prevent imminent or life-threatening deterioration. Critical care was  time spent personally by me on the following activities: development of treatment plan with patient and/or surrogate as well as nursing, discussions with consultants, evaluation of patient's response to treatment, examination of patient, obtaining history from patient or surrogate, ordering and performing treatments and interventions, ordering and review of laboratory studies, ordering and review of radiographic studies, pulse oximetry and re-evaluation of patient's condition.   Medications Ordered in ED Medications  0.9 %  sodium chloride infusion (has no administration in time range)  aztreonam (AZACTAM) 1 g injection (has no administration in time range)  aztreonam (AZACTAM) 2 g in sodium chloride 0.9 % 100 mL IVPB (has no administration in time range)  vancomycin (VANCOCIN) IVPB 1000 mg/200 mL premix (has no administration in time range)  sodium chloride 0.9 % bolus 1,000 mL (has no administration in time range)  sodium chloride 0.9 % bolus 500 mL (0 mLs Intravenous Stopped 06/11/19 1818)  ondansetron (ZOFRAN) injection 4 mg (4 mg Intravenous Given 06/11/19 1549)  acetaminophen (TYLENOL) tablet 650 mg (1,000 mg Oral Given 06/11/19 1550)  aztreonam (AZACTAM) 2 g in sodium chloride 0.9 % 100 mL IVPB (0 g Intravenous Stopped 06/11/19 1818)  metroNIDAZOLE (FLAGYL) IVPB 500 mg (0 mg Intravenous Stopped 06/11/19 1818)  vancomycin (VANCOCIN) IVPB 1000 mg/200 mL premix (0 mg Intravenous Stopped 06/11/19 1818)  iohexol (OMNIPAQUE) 300 MG/ML solution 100 mL (100 mLs Intravenous Contrast Given 06/11/19 1739)     Initial Impression / Assessment and Plan / ED Course  I have reviewed the triage vital signs and the nursing notes.  Pertinent labs & imaging results that were available during my care of the patient were reviewed by me and considered in my medical decision making (see chart for details).       Patient with vital signs on presentation meeting sepsis criteria.  Sepsis order set initiated.  Patient's  main complaint was suprapubic abdominal pain.  But urinalysis not strongly suggestive of urinary tract infection.  The prostate fraction is still a possibility with this being a male.  Patient started on broad-spectrum antibiotics.  Patient given Tylenol.  Patient has a history of cirrhosis but liver functions tests here today without any significant abnormalities.  CT scan of the abdomen just showed some thickening of the bladder area making it sounds as if there may be urinary tract cause.  Fever improved.  Initial lactic acid was less than 2 but a second lactic acid was at 2.  Patient did not meet criteria for the full 30 cc/kg fluid challenge.  But patient given a total of 1.5 L of normal saline bolus.  COVID testing was negative.  Chest x-ray negative for any acute findings.  Blood cultures urine culture pending.  Patient will require admission will  discuss with hospitalist.   Final Clinical Impressions(s) / ED Diagnoses   Final diagnoses:  Sepsis without acute organ dysfunction, due to unspecified organism [facility](HCC)    ED Discharge Orders    None       Vanetta MuldersZackowski, Doloras Tellado, MD 06/11/19 1911  Had long conversation with patient he refuses admission he understands that it could be life-threatening to go home he meets sepsis criteria parameters still tachycardic.  Not hypotensive lactic acid actually gone up.  Patient states he understands.  He understands that he could die from this infection.  Patient insist on going home states he will follow-up with his regular doctor.    Vanetta MuldersZackowski, Halia Franey, MD 06/11/19 2025

## 2019-06-11 NOTE — ED Notes (Signed)
ED Provider at bedside. He spoke with the patient regarding the necessity of the being admitted.n Patient still wants to go home to call his PCP in AM.

## 2019-06-11 NOTE — Progress Notes (Signed)
Pharmacy Antibiotic Note  Alejandro Macdonald is a 53 y.o. male admitted on 06/11/2019 with sepsis.  Pharmacy has been consulted for azactam and vancomycin dosing.  Plan: Azactam 2 gm IV every 8 hours  Vancomycin 1 gram every 12 hours  Height: 5\' 5"  (165.1 cm) Weight: 150 lb (68 kg) IBW/kg (Calculated) : 61.5  Temp (24hrs), Avg:102.6 F (39.2 C), Min:102.6 F (39.2 C), Max:102.6 F (39.2 C)  Recent Labs  Lab 06/11/19 1545  WBC 12.6*  CREATININE 1.04  LATICACIDVEN 1.7    Estimated Creatinine Clearance: 70.6 mL/min (by C-G formula based on SCr of 1.04 mg/dL).    Allergies  Allergen Reactions  . Penicillins Anaphylaxis    Antimicrobials this admission: Metronidazole 500 mg IV once Azactam 2 gms IV once Vancomycin 1 gm IV once  Dose adjustments this admission:  Vancomycin 1 gram IV every 12 hours, to provide AUC 500 mcg Azactam 2 grams IV every 8 hours.   Microbiology results: pending  Thank you for allowing pharmacy to be a part of this patient's care.  Mallie Mussel A Aviel Davalos 06/11/2019 4:28 PM

## 2019-06-11 NOTE — ED Triage Notes (Addendum)
Low abd pain and fever x 1 week. Vomited x 1 today. Denies diarrhea. He has been taking some left over flagyl that he had at home.

## 2019-06-11 NOTE — ED Notes (Signed)
Patient refused to be admitted even after his conversation with EDP.

## 2019-06-11 NOTE — ED Notes (Signed)
EDP spoke to the patient and informed him that he will be admitted to the hospital.  He asked MD if he can call his wife first.  After talking to the person on the other line, he told me that he need to go home today and he will call his doctor in AM.  EDP notified of patient's decision.

## 2019-06-11 NOTE — ED Notes (Signed)
ED Provider at bedside. 

## 2019-06-12 ENCOUNTER — Encounter (HOSPITAL_COMMUNITY): Payer: Self-pay

## 2019-06-12 ENCOUNTER — Other Ambulatory Visit: Payer: Self-pay

## 2019-06-12 ENCOUNTER — Telehealth (HOSPITAL_BASED_OUTPATIENT_CLINIC_OR_DEPARTMENT_OTHER): Payer: Self-pay | Admitting: *Deleted

## 2019-06-12 ENCOUNTER — Inpatient Hospital Stay (HOSPITAL_COMMUNITY)
Admission: EM | Admit: 2019-06-12 | Discharge: 2019-06-14 | DRG: 872 | Disposition: A | Discharge status: Home / Self Care | Payer: Medicaid Other | Attending: Internal Medicine | Admitting: Internal Medicine

## 2019-06-12 DIAGNOSIS — Z20828 Contact with and (suspected) exposure to other viral communicable diseases: Secondary | ICD-10-CM | POA: Diagnosis present

## 2019-06-12 DIAGNOSIS — R7881 Bacteremia: Secondary | ICD-10-CM | POA: Diagnosis not present

## 2019-06-12 DIAGNOSIS — Z79899 Other long term (current) drug therapy: Secondary | ICD-10-CM

## 2019-06-12 DIAGNOSIS — B9689 Other specified bacterial agents as the cause of diseases classified elsewhere: Secondary | ICD-10-CM | POA: Diagnosis not present

## 2019-06-12 DIAGNOSIS — Z880 Allergy status to penicillin: Secondary | ICD-10-CM

## 2019-06-12 DIAGNOSIS — A4159 Other Gram-negative sepsis: Principal | ICD-10-CM | POA: Diagnosis present

## 2019-06-12 DIAGNOSIS — E876 Hypokalemia: Secondary | ICD-10-CM | POA: Diagnosis present

## 2019-06-12 DIAGNOSIS — Z8249 Family history of ischemic heart disease and other diseases of the circulatory system: Secondary | ICD-10-CM

## 2019-06-12 DIAGNOSIS — A4150 Gram-negative sepsis, unspecified: Secondary | ICD-10-CM | POA: Diagnosis present

## 2019-06-12 LAB — CBC WITH DIFFERENTIAL/PLATELET
Abs Immature Granulocytes: 0.07 10*3/uL (ref 0.00–0.07)
Basophils Absolute: 0 10*3/uL (ref 0.0–0.1)
Basophils Relative: 0 %
Eosinophils Absolute: 0 10*3/uL (ref 0.0–0.5)
Eosinophils Relative: 0 %
HCT: 45.1 % (ref 39.0–52.0)
Hemoglobin: 15.3 g/dL (ref 13.0–17.0)
Immature Granulocytes: 1 %
Lymphocytes Relative: 5 %
Lymphs Abs: 0.7 10*3/uL (ref 0.7–4.0)
MCH: 30.3 pg (ref 26.0–34.0)
MCHC: 33.9 g/dL (ref 30.0–36.0)
MCV: 89.3 fL (ref 80.0–100.0)
Monocytes Absolute: 1.4 10*3/uL — ABNORMAL HIGH (ref 0.1–1.0)
Monocytes Relative: 10 %
Neutro Abs: 10.8 10*3/uL — ABNORMAL HIGH (ref 1.7–7.7)
Neutrophils Relative %: 84 %
Platelets: 169 10*3/uL (ref 150–400)
RBC: 5.05 MIL/uL (ref 4.22–5.81)
RDW: 13 % (ref 11.5–15.5)
WBC: 13 10*3/uL — ABNORMAL HIGH (ref 4.0–10.5)
nRBC: 0 % (ref 0.0–0.2)

## 2019-06-12 LAB — BLOOD CULTURE ID PANEL (REFLEXED)

## 2019-06-12 LAB — COMPREHENSIVE METABOLIC PANEL
ALT: 25 U/L (ref 0–44)
AST: 22 U/L (ref 15–41)
Albumin: 4 g/dL (ref 3.5–5.0)
Alkaline Phosphatase: 112 U/L (ref 38–126)
Anion gap: 10 (ref 5–15)
BUN: 12 mg/dL (ref 6–20)
CO2: 20 mmol/L — ABNORMAL LOW (ref 22–32)
Calcium: 7.9 mg/dL — ABNORMAL LOW (ref 8.9–10.3)
Chloride: 105 mmol/L (ref 98–111)
Creatinine, Ser: 0.97 mg/dL (ref 0.61–1.24)
GFR calc Af Amer: >60 mL/min (ref >60)
GFR calc non Af Amer: >60 mL/min (ref >60)
Glucose, Bld: 93 mg/dL (ref 70–99)
Potassium: 3.4 mmol/L — ABNORMAL LOW (ref 3.5–5.1)
Sodium: 135 mmol/L (ref 135–145)
Total Bilirubin: 2.5 mg/dL — ABNORMAL HIGH (ref 0.3–1.2)
Total Protein: 8.2 g/dL — ABNORMAL HIGH (ref 6.5–8.1)

## 2019-06-12 LAB — URINALYSIS, ROUTINE W REFLEX MICROSCOPIC
Bilirubin Urine: NEGATIVE
Glucose, UA: NEGATIVE mg/dL
Hgb urine dipstick: NEGATIVE
Ketones, ur: 5 mg/dL — AB
Leukocytes,Ua: NEGATIVE
Nitrite: NEGATIVE
Protein, ur: NEGATIVE mg/dL
Specific Gravity, Urine: 1.006 (ref 1.005–1.030)
pH: 6 (ref 5.0–8.0)

## 2019-06-12 LAB — URINE CULTURE: Culture: NO GROWTH

## 2019-06-12 LAB — PROTIME-INR
INR: 1.2 (ref 0.8–1.2)
Prothrombin Time: 15.1 seconds (ref 11.4–15.2)

## 2019-06-12 LAB — LACTIC ACID, PLASMA: Lactic Acid, Venous: 1.1 mmol/L (ref 0.5–1.9)

## 2019-06-12 LAB — APTT: aPTT: 34 seconds (ref 24–36)

## 2019-06-12 MED ORDER — METRONIDAZOLE IN NACL 5-0.79 MG/ML-% IV SOLN: 500.0000 mg | Freq: Once | INTRAVENOUS | Status: DC

## 2019-06-12 MED ORDER — SODIUM CHLORIDE 0.9 % IV SOLN
1000.0000 mL | INTRAVENOUS | Status: AC
  Administered 2019-06-12: 1000 mL via INTRAVENOUS

## 2019-06-12 MED ORDER — ONDANSETRON HCL 4 MG PO TABS: 4.0000 mg | ORAL_TABLET | Freq: Four times a day (QID) | ORAL | Status: DC | PRN

## 2019-06-12 MED ORDER — LEVOFLOXACIN IN D5W 750 MG/150ML IV SOLN
750.0000 mg | INTRAVENOUS | Status: DC
  Administered 2019-06-12 – 2019-06-13 (×2): 750 mg via INTRAVENOUS
  Filled 2019-06-12 (×3): qty 150

## 2019-06-12 MED ORDER — POTASSIUM CHLORIDE 20 MEQ/15ML (10%) PO SOLN
40.0000 meq | Freq: Once | ORAL | Status: AC
  Administered 2019-06-13: 40 meq via ORAL
  Filled 2019-06-12: qty 30

## 2019-06-12 MED ORDER — ENOXAPARIN SODIUM 40 MG/0.4ML ~~LOC~~ SOLN
40.0000 mg | SUBCUTANEOUS | Status: DC
  Filled 2019-06-12: qty 0.4

## 2019-06-12 MED ORDER — ACETAMINOPHEN 650 MG RE SUPP: 650.0000 mg | Freq: Four times a day (QID) | RECTAL | Status: DC | PRN

## 2019-06-12 MED ORDER — ACETAMINOPHEN 325 MG PO TABS
650.0000 mg | ORAL_TABLET | Freq: Four times a day (QID) | ORAL | Status: DC | PRN
  Administered 2019-06-13 (×2): 650 mg via ORAL
  Filled 2019-06-12 (×2): qty 2

## 2019-06-12 MED ORDER — ACETAMINOPHEN 325 MG PO TABS
650.0000 mg | ORAL_TABLET | Freq: Once | ORAL | Status: AC
  Administered 2019-06-12: 20:00:00 650 mg via ORAL
  Filled 2019-06-12: qty 2

## 2019-06-12 MED ORDER — SODIUM CHLORIDE 0.9 % IV SOLN
1000.0000 mL | INTRAVENOUS | Status: DC
  Administered 2019-06-12: 1000 mL via INTRAVENOUS

## 2019-06-12 MED ORDER — SODIUM CHLORIDE 0.9 % IV SOLN: 2.0000 g | Freq: Once | INTRAVENOUS | Status: DC

## 2019-06-12 MED ORDER — ONDANSETRON HCL 4 MG/2ML IJ SOLN: 4.0000 mg | Freq: Four times a day (QID) | INTRAMUSCULAR | Status: DC | PRN

## 2019-06-12 MED ORDER — SODIUM CHLORIDE 0.9 % IV BOLUS (SEPSIS)
30.0000 mL/kg | Freq: Once | INTRAVENOUS | Status: AC
  Administered 2019-06-12: 20:00:00 2040 mL via INTRAVENOUS

## 2019-06-12 NOTE — ED Provider Notes (Signed)
[facility] DEPT Provider Note   CSN: [id number] Arrival date & time: 06/12/19  1523     History   Chief Complaint Chief Complaint  Patient presents with   Positive blood cultures    HPI Alejandro Macdonald is a 53 y.o. male.     HPI Patient has been having abd pain and fever since Monday.  He had flagyl at home so he decided to take it but did not notice any improvement.Marland Kitchen  He denies andy diarrhea.  He denies and cough, uri.  He has noticed that his urine is darker than usual even though he had been drinking a lot of fluids..  He had an ED evaluation yesterday and the plan was to admit him for possible sepsis.  Pt decided to leave AMA.  He returns today because of positive blood  Cultures.  He was notified and agrees  Past Medical History:  Diagnosis Date   Liver disease     There are no active problems to display for this patient.   Past Surgical History:  Procedure Laterality Date   LIVER SURGERY          Home Medications    Prior to Admission medications   Medication Sig Start Date End Date Taking? Authorizing Provider  acetaminophen (TYLENOL) 500 MG tablet Take 500 mg by mouth every 6 (six) hours as needed (shivering).   Yes [provider]    Family History History reviewed. No pertinent family history.  Social History Social History   Tobacco Use   Smoking status: Never Smoker   Smokeless tobacco: Never Used  Substance Use Topics   Alcohol use: No   Drug use: No     Allergies   Penicillins   Review of Systems Review of Systems  All other systems reviewed and are negative.    Physical Exam Updated Vital Signs BP 135/89    Pulse 91    Temp (!) 102 F (38.9 C) (Oral)    Resp 10    Ht 1.651 m (5\' 5" )    Wt 68 kg    SpO2 99%    BMI 24.96 kg/m   Physical Exam Vitals signs and nursing note reviewed.  Constitutional:      General: He is not in acute distress.    Appearance: He is well-developed.    HENT:     Head: Normocephalic and atraumatic.     Right Ear: External ear normal.     Left Ear: External ear normal.  Eyes:     General: No scleral icterus.       Right eye: No discharge.        Left eye: No discharge.     Conjunctiva/sclera: Conjunctivae normal.  Neck:     Musculoskeletal: Neck supple.     Trachea: No tracheal deviation.  Cardiovascular:     Rate and Rhythm: Normal rate and regular rhythm.  Pulmonary:     Effort: Pulmonary effort is normal. No respiratory distress.     Breath sounds: Normal breath sounds. No stridor. No wheezing or rales.  Abdominal:     General: Bowel sounds are normal. There is no distension.     Palpations: Abdomen is soft.     Tenderness: There is no abdominal tenderness. There is no guarding or rebound.  Musculoskeletal:        General: No tenderness.  Skin:    General: Skin is warm and dry.     Findings: No rash.  Neurological:  Mental Status: He is alert.     Cranial Nerves: No cranial nerve deficit (no facial droop, extraocular movements intact, no slurred speech).     Sensory: No sensory deficit.     Motor: No abnormal muscle tone or seizure activity.     Coordination: Coordination normal.      ED Treatments / Results  Labs (all labs ordered are listed, but only abnormal results are displayed) Labs Reviewed  COMPREHENSIVE METABOLIC PANEL - Abnormal; Notable for the following components:      Result Value   Potassium 3.4 (*)    CO2 20 (*)    Calcium 7.9 (*)    Total Protein 8.2 (*)    Total Bilirubin 2.5 (*)    All other components within normal limits  CBC WITH DIFFERENTIAL/PLATELET - Abnormal; Notable for the following components:   WBC 13.0 (*)    Neutro Abs 10.8 (*)    Monocytes Absolute 1.4 (*)    All other components within normal limits  URINALYSIS, ROUTINE W REFLEX MICROSCOPIC - Abnormal; Notable for the following components:   Ketones, ur 5 (*)    All other components within normal limits  CULTURE, BLOOD  (ROUTINE X 2)  CULTURE, BLOOD (ROUTINE X 2)  URINE CULTURE  LACTIC ACID, PLASMA  APTT  PROTIME-INR    EKG EKG Interpretation  Date/Time:  Friday June 12 2019 17:22:29 EDT Ventricular Rate:  93 PR Interval:    QRS Duration: 86 QT Interval:  352 QTC Calculation: 438 R Axis:   43 Text Interpretation:  Sinus rhythm Low voltage, precordial leads Since last tracing rate slower Confirmed by Linwood Dibbles [phone]) on 06/12/2019 5:47:47 PM   Radiology Ct Abdomen Pelvis W Contrast  Result Date: 06/11/2019 CLINICAL DATA:  Abdominal pain with diverticulitis suspected. EXAM: CT ABDOMEN AND PELVIS WITH CONTRAST TECHNIQUE: Multidetector CT imaging of the abdomen and pelvis was performed using the standard protocol following bolus administration of intravenous contrast. CONTRAST:  OMNIPAQUE IOHEXOL 300 MG/ML  SOLN COMPARISON:  September 29, 2007. FINDINGS: Lower chest: The lung bases are clear. The heart size is normal. Hepatobiliary: There is intrahepatic biliary ductal dilatation involving the left hepatic lobe. This appears similar to prior CT in 2008. There is persistent pneumobilia, similar to prior CT. Pancreas: Normal contours without ductal dilatation. No peripancreatic fluid collection. Spleen: No splenic laceration or hematoma. Adrenals/Urinary Tract: --Adrenal glands: No adrenal hemorrhage. --Right kidney/ureter: No hydronephrosis or perinephric hematoma. --Left kidney/ureter: No hydronephrosis or perinephric hematoma. --Urinary bladder: There is some mild bladder wall thickening. Stomach/Bowel: --Stomach/Duodenum: No hiatal hernia or other gastric abnormality. Normal duodenal course and caliber. 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 --Colon: No focal abnormality. --Appendix: Normal. Vascular/Lymphatic: Normal course and caliber of the major abdominal vessels. --No retroperitoneal lymphadenopathy. --No mesenteric lymphadenopathy. --No pelvic or  inguinal lymphadenopathy. Reproductive: Unremarkable Other: No ascites or free air. 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 Musculoskeletal. No acute displaced fractures. IMPRESSION: 1. No acute abdominopelvic process. 2. Mild bladder wall thickening. Correlate with urinalysis for possible cystitis. 3. Additional chronic findings as detailed above. Electronically Signed   By: Katherine Mantle M.D.   On: 06/11/2019 18:30   Dg Chest Port 1 View  Result Date: 06/11/2019 CLINICAL DATA:  53 year old with one-week history of fever and abdominal pain. Nausea and vomiting that started today. EXAM: PORTABLE CHEST 1 VIEW COMPARISON:  11/08/2014. FINDINGS: Cardiac silhouette normal in size, unchanged. Thoracic aorta minimally tortuous. Hilar and mediastinal contours otherwise unremarkable.  Lungs clear. Bronchovascular markings normal. Pulmonary vascularity normal. No visible pleural effusions. No pneumothorax. IMPRESSION: No acute cardiopulmonary disease. Electronically Signed   By: Hulan Saashomas  Lawrence M.D.   On: 06/11/2019 16:15    Procedures Procedures (including critical care time)  Medications Ordered in ED Medications  levofloxacin (LEVAQUIN) IVPB 750 mg (750 mg Intravenous New Bag/Given 06/12/19 1800)  acetaminophen (TYLENOL) tablet 650 mg (has no administration in time range)  sodium chloride 0.9 % bolus 2,040 mL (has no administration in time range)    Followed by  0.9 %  sodium chloride infusion (has no administration in time range)     Initial Impression / Assessment and Plan / ED Course  I have reviewed the triage vital signs and the nursing notes.  Pertinent labs & imaging results that were available during my care of the patient were reviewed by me and considered in my medical decision making (see chart for details).  Clinical Course as of Jun 11 1900  Fri Jun 12, 2019  1900 Patient labs are notable for persistent elevated white blood cell count.  Electrolyte panel is  unremarkable.  Urinalysis is unremarkable.  Lactic acid level is normal.   [JK]    Clinical Course User Index [JK] Linwood DibblesKnapp, Talyah Seder, MD      Patient has gram-negative rods in his blood cultures from yesterday.  He continues to have persistent fever.  Source of this gram-negative bacteremia somewhat unclear.  Patient did have an abdominal CT scan without any acute findings yesterday.  He remains hemodynamically stable.  No hypotension.  No tachycardia.  No signs of severe sepsis.  Plan on admission for further treatment.  Final Clinical Impressions(s) / ED Diagnoses   Final diagnoses:  Gram negative sepsis [facility](HCC)      Linwood DibblesKnapp, Todrick Siedschlag, MD 06/12/19 1902

## 2019-06-12 NOTE — Progress Notes (Signed)
Pharmacy Antibiotic Note  Alejandro Macdonald is a 53 y.o. male admitted on 06/12/2019 with bacteremia.  Pharmacy has been consulted for levaquin dosing. 9/3 BC2: BCID 3/4 bottles Enterobacter cloacae complex Pt with anaphylaxis to PCN so can't use cefepime.   Plan: Levaquin 750 mg IV q24 F/u renal fxn F/u sensitivities to Enterobacter cloacae  Height: 5\' 5"  (165.1 cm) Weight: 150 lb (68 kg) IBW/kg (Calculated) : 61.5  Temp (24hrs), Avg:100.9 F (38.3 C), Min:99.1 F (37.3 C), Max:102 F (38.9 C)  Recent Labs  Lab 06/11/19 1545 06/11/19 1620 06/12/19 1643  WBC 12.6*  --  13.0*  CREATININE 1.04  --   --   LATICACIDVEN 1.7 2.0*  --     Estimated Creatinine Clearance: 70.6 mL/min (by C-G formula based on SCr of 1.04 mg/dL).     Antimicrobials this admission: 9/4 levaquin>> 9/3 Aztreonam x 1 @[facility]  9/3 flagyl x 1 @[facility]  9/3 Vanc x 1 @[facility]  Dose adjustments this admission:  Microbiology results: 9/3 QJJ:HERD 9/3 Covid: net 9/3 BC2: BCID 3/4 bottles Enterobacter cloacae complex  Thank you for allowing pharmacy to be a part of this patient's care.  Eudelia Bunch, Pharm.D [phone] 06/12/2019 5:48 PM

## 2019-06-12 NOTE — ED Triage Notes (Signed)
Pt presents with c/o positive blood cultures. Pt was seen yesterday for abdominal pain and received a call this morning reporting that he had a positive blood culture and needed to report to the hospital immediately for treatment and admission.

## 2019-06-12 NOTE — ED Provider Notes (Addendum)
I was informed by nursing that this patient's blood cultures have come back positive with Enterobacter species.  Upon chart review, he was seen in the emergency department recently, received IV antibiotics, but refused admission and left AGAINST MEDICAL ADVICE.  I attempted to call the patient and his emergency contact, no answer.  Nursing staff will try again throughout the day.  Will advise that he return immediately to the emergency department for management.  1:10 PM update: I received a call back from the patient, who had received a voicemail from the charge nurse.  I informed the patient of his condition, bloodstream infection, he expressed understanding that this is a life-threatening condition and that he needs continued inpatient care.  Patient agrees to go directly to [facility] long hospital to be reevaluated in the emergency department and then admitted.  Patient requested that I speak with his primary care doctor, Dr. Eugenia Pancoast and inform this doctor of his condition.  I attempted to call Dr. Dina Rich twice with no response.  Barth Kirks. Sedonia Small, [place] [email or web address]    Maudie Flakes, MD 06/12/19 1026    Maudie Flakes, MD 06/12/19 1315

## 2019-06-12 NOTE — Sepsis Progress Note (Signed)
Alejandro Macdonald. Came to ED yesterday with c/o abdominal pain and fever x 1 week. He later left ED AMA.  His 9/3 blood cultures were (+) for GNR/ Enterobacter. He was called to return to ED for treatment. He was given Levaquin at 1800. No repeat blood cx done. Lactate in progress.

## 2019-06-12 NOTE — H&P (Signed)
History and Physical    Alejandro Macdonald BDZ:[id number] DOB: Oct 31, 1965 DOA: 06/12/2019  PCP: Premier, Donley At  Patient coming from: Home  I have personally briefly reviewed patient's old medical records in [place]  Chief Complaint: Fevers, chills  HPI: Alejandro Macdonald is a 53 y.o. 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  Patient reports new onset of lower abdominal pain, fevers, chills, and diaphoresis began on 06/08/2019.  He reports a similar episode approximately 4 years ago at which time he was treated with Flagyl.  He had some Flagyl at home which he started taking after symptom onset but had no improvement.    He presented to the ED on 06/11/2019 for further evaluation.  He had a fever up to 103.2 Fahrenheit with WBC 12.6.Marland Kitchen  Portable chest x-ray was without acute findings.  CT abdomen/pelvis with contrast showed intrahepatic biliary ductal dilatation involving the left hepatic lobe and persistent pneumobilia which are similar findings since CT in 2008.  There was no acute abdominal pelvic process.  Urinalysis was negative.  He was started on empiric IV vancomycin, metronidazole, and aztreonam.  Blood cultures were obtained.  Plan was to admit for further management, however patient left AGAINST MEDICAL ADVICE.  SARS-CoV-2 test was negative 06/11/2019.  Blood culture results returned positive for Enterobacter cloacae complex in 3/4 bottles.  Patient was called to return to the ED for further evaluation management.  Patient states his abdominal pain has improved since ED presentation yesterday.  He otherwise denies any chest pain, palpitations, abdominal pain, diarrhea, constipation, dysuria, skin changes or rash.  He denies any IV drug use.  He reports a history of penicillin allergy resulting  in anaphylaxis in the past.  ED Course:  Vitals showed BP 118/75, pulse 118, RR 18, temp 102.0 Fahrenheit, SPO2 98% on room air.  Labs notable for WBC 13.0, Potassium 3.4, BUN 12, creatinine 0.97, Hemoglobin 15.3, platelets 169,000, urinalysis negative for UTI, lactic acid 1.1.  Repeat blood cultures were obtained and pending.  Patient was given 2 L normal saline and started on IV Levaquin and the hospitalist service was consulted to admit for further evaluation management.  Review of Systems: All systems reviewed and are negative except as documented in history of present illness above.   Past Medical History:  Diagnosis Date  . Liver disease     Past Surgical History:  Procedure Laterality Date  . LIVER SURGERY      Social History:  reports that he has never smoked. He has never used smokeless tobacco. He reports that he does not drink alcohol or use drugs.  Allergies  Allergen Reactions  . Penicillins Anaphylaxis    Family History  Problem Relation Age of Onset  . Hypertension Mother   . Heart attack Father      Prior to Admission medications   Medication Sig Start Date End Date Taking? Authorizing Provider  acetaminophen (TYLENOL) 500 MG tablet Take 500 mg by mouth every 6 (six) hours as needed (shivering).   Yes [provider]    Physical Exam: Vitals:   06/12/19 1933 06/12/19 2000 06/12/19 2140 06/12/19 2222  BP: 121/86 (!) 140/99 (!) 143/93 (!) 142/94  Pulse: 94 95 85 81  Resp: (!) 23 18 16 20   Temp:   98.6 F (37 C) 100.1 F (37.8 C)  TempSrc:   Oral  Oral  SpO2: 98% 100% 99% 100%  Weight:      Height:        Constitutional: Resting supine in bed, NAD, calm, comfortable Eyes: PERRL, lids and conjunctivae normal ENMT: Mucous membranes are moist. Posterior pharynx clear of any exudate or lesions.Normal dentition.  Neck: normal, supple, no masses. Respiratory: clear to auscultation bilaterally, no wheezing, no crackles. Normal respiratory  effort. No accessory muscle use.  Cardiovascular: Regular rate and rhythm, no murmurs / rubs / gallops. No extremity edema. 2+ pedal pulses. Abdomen: no tenderness, no masses palpated. No hepatosplenomegaly. Bowel sounds positive.  Musculoskeletal: no clubbing / cyanosis. No joint deformity upper and lower extremities. Good ROM, no contractures. Normal muscle tone.  Skin: Diaphoretic, no rashes, lesions, ulcers. No induration Neurologic: CN 2-12 grossly intact. Sensation intact,Strength 5/5 in all 4.  Psychiatric: Normal judgment and insight. Alert and oriented x 3. Normal mood.     Labs on Admission: I have personally reviewed following labs and imaging studies  CBC: Recent Labs  Lab 06/11/19 1545 06/12/19 1643  WBC 12.6* 13.0*  NEUTROABS 9.9* 10.8*  HGB 16.3 15.3  HCT 48.4 45.1  MCV 88.6 89.3  PLT 204 169   Basic Metabolic Panel: Recent Labs  Lab 06/11/19 1545 06/12/19 1643  NA 131* 135  K 3.7 3.4*  CL 100 105  CO2 22 20*  GLUCOSE 171* 93  BUN 13 12  CREATININE 1.04 0.97  CALCIUM 8.5* 7.9*   GFR: Estimated Creatinine Clearance: 75.7 mL/min (by C-G formula based on SCr of 0.97 mg/dL). Liver Function Tests: Recent Labs  Lab 06/11/19 1545 06/12/19 1643  AST 23 22  ALT 28 25  ALKPHOS 116 112  BILITOT 1.6* 2.5*  PROT 8.3* 8.2*  ALBUMIN 4.2 4.0   Recent Labs  Lab 06/11/19 1545  LIPASE 31   No results for input(s): AMMONIA in the last 168 hours. Coagulation Profile: Recent Labs  Lab 06/11/19 1545 06/12/19 1643  INR 1.0 1.2   Cardiac Enzymes: No results for input(s): CKTOTAL, CKMB, CKMBINDEX, TROPONINI in the last 168 hours. BNP (last 3 results) No results for input(s): PROBNP in the last 8760 hours. HbA1C: No results for input(s): HGBA1C in the last 72 hours. CBG: No results for input(s): GLUCAP in the last 168 hours. Lipid Profile: No results for input(s): CHOL, HDL, LDLCALC, TRIG, CHOLHDL, LDLDIRECT in the last 72 hours. Thyroid Function Tests:  No results for input(s): TSH, T4TOTAL, FREET4, T3FREE, THYROIDAB in the last 72 hours. Anemia Panel: No results for input(s): VITAMINB12, FOLATE, FERRITIN, TIBC, IRON, RETICCTPCT in the last 72 hours. Urine analysis:    Component Value Date/Time   COLORURINE YELLOW 06/12/2019 1750   APPEARANCEUR CLEAR 06/12/2019 1750   LABSPEC 1.006 06/12/2019 1750   PHURINE 6.0 06/12/2019 1750   GLUCOSEU NEGATIVE 06/12/2019 1750   HGBUR NEGATIVE 06/12/2019 1750   BILIRUBINUR NEGATIVE 06/12/2019 1750   KETONESUR 5 (A) 06/12/2019 1750   PROTEINUR NEGATIVE 06/12/2019 1750   UROBILINOGEN 0.2 11/08/2014 2031   NITRITE NEGATIVE 06/12/2019 1750   LEUKOCYTESUR NEGATIVE 06/12/2019 1750    Radiological Exams on Admission: Ct Abdomen Pelvis W Contrast  Result Date: 06/11/2019 CLINICAL DATA:  Abdominal pain with diverticulitis suspected. EXAM: CT ABDOMEN AND PELVIS WITH CONTRAST TECHNIQUE: Multidetector CT imaging of the abdomen and pelvis was performed using the standard protocol following bolus administration of intravenous contrast. CONTRAST:  OMNIPAQUE IOHEXOL 300 MG/ML  SOLN COMPARISON:  September 29, 2007. FINDINGS: Lower chest: The lung bases are clear. The  heart size is normal. Hepatobiliary: There is intrahepatic biliary ductal dilatation involving the left hepatic lobe. This appears similar to prior CT in 2008. There is persistent pneumobilia, similar to prior CT. Pancreas: Normal contours without ductal dilatation. No peripancreatic fluid collection. Spleen: No splenic laceration or hematoma. Adrenals/Urinary Tract: --Adrenal glands: No adrenal hemorrhage. --Right kidney/ureter: No hydronephrosis or perinephric hematoma. --Left kidney/ureter: No hydronephrosis or perinephric hematoma. --Urinary bladder: There is some mild bladder wall thickening. Stomach/Bowel: --Stomach/Duodenum: No hiatal hernia or other gastric abnormality. Normal duodenal course and caliber. --Small bowel: There are postsurgical  changes suggestive of a prior hepaticojejunostomy. --Colon: No focal abnormality. --Appendix: Normal. Vascular/Lymphatic: Normal course and caliber of the major abdominal vessels. --No retroperitoneal lymphadenopathy. --No mesenteric lymphadenopathy. --No pelvic or inguinal lymphadenopathy. Reproductive: Unremarkable Other: No ascites or free air. 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 Musculoskeletal. No acute displaced fractures. IMPRESSION: 1. No acute abdominopelvic process. 2. Mild bladder wall thickening. Correlate with urinalysis for possible cystitis. 3. Additional chronic findings as detailed above. Electronically Signed   By: Katherine Mantlehristopher  Green M.D.   On: 06/11/2019 18:30   Dg Chest Port 1 View  Result Date: 06/11/2019 CLINICAL DATA:  53 year old with one-week history of fever and abdominal pain. Nausea and vomiting that started today. EXAM: PORTABLE CHEST 1 VIEW COMPARISON:  11/08/2014. FINDINGS: Cardiac silhouette normal in size, unchanged. Thoracic aorta minimally tortuous. Hilar and mediastinal contours otherwise unremarkable. Lungs clear. Bronchovascular markings normal. Pulmonary vascularity normal. No visible pleural effusions. No pneumothorax. IMPRESSION: No acute cardiopulmonary disease. Electronically Signed   By: Hulan Saashomas  Lawrence M.D.   On: 06/11/2019 16:15    EKG: Independently reviewed. Sinus rhythm, no acute ischemic changes.  Assessment/Plan Active Problems:   Bacteremia due to Enterobacter species  Alejandro Macdonald is a 53 y.o. 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   Enterobacter cloacae complex bacteremia: Unclear source, suspect underlying GI source however CT abdomen/pelvis shows unchanged appearance of prior intrahepatic biliary ductal dilatation and pneumobilia since 2008. -Continue IV  Levaquin (penicillin allergy) -Follow blood cultures 06/11/2019 for sensitivities and repeat blood cultures drawn on 06/12/2019  Hypokalemia: Repleting.  DVT prophylaxis: Lovenox Code Status: Full code, confirmed with patient Family Communication: Discussed with patient, patient discussed with family Disposition Plan: Pending clinical progress Consults called: None Admission status: Inpatient for management of Enterobacter bacteremia requiring IV antibiotic therapy and further cultural data.   Darreld McleanVishal Demba Nigh MD [facility]  If 7PM-7AM, please contact night-coverage [email or web address]  06/13/2019, 12:20 AM

## 2019-06-12 NOTE — Progress Notes (Signed)
A consult was received from an ED physician for aztreonam per pharmacy dosing.  The patient's profile has been reviewed for ht/wt/allergies/indication/available labs.   A one time order has been placed for aztreonam 2 gm.    Further antibiotics/pharmacy consults should be ordered by admitting physician if indicated.                       Thank you, Eudelia Bunch, Pharm.D [phone] 06/12/2019 5:21 PM

## 2019-06-13 LAB — COMPREHENSIVE METABOLIC PANEL
ALT: 21 U/L (ref 0–44)
AST: 20 U/L (ref 15–41)
Albumin: 3.3 g/dL — ABNORMAL LOW (ref 3.5–5.0)
Alkaline Phosphatase: 91 U/L (ref 38–126)
Anion gap: 4 — ABNORMAL LOW (ref 5–15)
BUN: 10 mg/dL (ref 6–20)
CO2: 20 mmol/L — ABNORMAL LOW (ref 22–32)
Calcium: 6.8 mg/dL — ABNORMAL LOW (ref 8.9–10.3)
Chloride: 114 mmol/L — ABNORMAL HIGH (ref 98–111)
Creatinine, Ser: 0.78 mg/dL (ref 0.61–1.24)
GFR calc Af Amer: >60 mL/min (ref >60)
GFR calc non Af Amer: >60 mL/min (ref >60)
Glucose, Bld: 151 mg/dL — ABNORMAL HIGH (ref 70–99)
Potassium: 4.2 mmol/L (ref 3.5–5.1)
Sodium: 138 mmol/L (ref 135–145)
Total Bilirubin: 1.6 mg/dL — ABNORMAL HIGH (ref 0.3–1.2)
Total Protein: 6.8 g/dL (ref 6.5–8.1)

## 2019-06-13 LAB — CBC
HCT: 40.7 % (ref 39.0–52.0)
Hemoglobin: 13.5 g/dL (ref 13.0–17.0)
MCH: 29.8 pg (ref 26.0–34.0)
MCHC: 33.2 g/dL (ref 30.0–36.0)
MCV: 89.8 fL (ref 80.0–100.0)
Platelets: 162 10*3/uL (ref 150–400)
RBC: 4.53 MIL/uL (ref 4.22–5.81)
RDW: 13 % (ref 11.5–15.5)
WBC: 9.4 10*3/uL (ref 4.0–10.5)
nRBC: 0 % (ref 0.0–0.2)

## 2019-06-13 LAB — HIV ANTIBODY (ROUTINE TESTING W REFLEX): HIV Screen 4th Generation wRfx: NONREACTIVE

## 2019-06-13 MED ORDER — HYDROCODONE-ACETAMINOPHEN 5-325 MG PO TABS
1.0000 | ORAL_TABLET | Freq: Four times a day (QID) | ORAL | Status: DC | PRN
  Administered 2019-06-13: 20:00:00 2 via ORAL
  Filled 2019-06-13: qty 2

## 2019-06-13 NOTE — Progress Notes (Signed)
PROGRESS NOTE    Alejandro Macdonald  ZYS:[id number] DOB: 13-Oct-1965 DOA: 06/12/2019 PCP: [facility]  Outpatient Specialists:   Brief Narrative:  Alejandro Macdonald is a 53 y.o. 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  Patient presented to the hospital with abdominal pain, fever and chills, with blood culture growing Enterobacter cloacae.  Final culture results are still pending.  Patient is currently on IV Levaquin.  No fever or chills reported today (06/13/2019).  Will await final cultures.  Assessment & Plan:   Active Problems:   Bacteremia due to Enterobacter species  Enterobacter cloacae complex bacteremia: Unclear source, suspect underlying GI source however CT abdomen/pelvis shows unchanged appearance of prior intrahepatic biliary ductal dilatation and pneumobilia since 2008. -Continue IV Levaquin (penicillin allergy) -Follow blood cultures 06/11/2019 for sensitivities and repeat blood cultures drawn on 06/12/2019 06/13/2019: Follow final cultures.  Continue IV Levaquin for now.  Fever, chills and abdominal pain have resolved.  Leukocytosis has also resolved.  Hypokalemia: Replaced.  Potassium was 4.2 today.  DVT prophylaxis: Lovenox Code Status: Full code Family Communication:  Disposition Plan:  Likely home.   Consults called: None  Procedures:   None.  Antimicrobials:   IV Levaquin   Subjective: No fever or chills. No abdominal pain.  Objective: Vitals:   06/12/19 2140 06/12/19 2222 06/13/19 0507 06/13/19 1413  BP: (!) 143/93 (!) 142/94 123/85 (!) 138/92  Pulse: 85 81 78 80  Resp: 16 20 18 16   Temp: 98.6 F (37 C) 100.1 F (37.8 C) 98.8 F (37.1 C) 98.2 F (36.8 C)  TempSrc: Oral Oral Oral   SpO2: 99% 100% 99% 98%  Weight:      Height:        Intake/Output Summary (Last 24 hours) at 06/13/2019 1715 Last data filed  at 06/13/2019 0900 Gross per 24 hour  Intake 1390 ml  Output 2 ml  Net 1388 ml   Filed Weights   06/12/19 1539  Weight: 68 kg    Examination:  General exam: Appears calm and comfortable  Respiratory system: Clear to auscultation.  Cardiovascular system: S1 & S2  Gastrointestinal system: Abdomen is nondistended, soft and nontender.  Central nervous system: Alert and oriented.  Patient moves all extremities. Extremities: No leg edema.  Data Reviewed: I have personally reviewed following labs and imaging studies  CBC: Recent Labs  Lab 06/11/19 1545 06/12/19 1643 06/13/19 0315  WBC 12.6* 13.0* 9.4  NEUTROABS 9.9* 10.8*  --   HGB 16.3 15.3 13.5  HCT 48.4 45.1 40.7  MCV 88.6 89.3 89.8  PLT 204 169 932   Basic Metabolic Panel: Recent Labs  Lab 06/11/19 1545 06/12/19 1643 06/13/19 0315  NA 131* 135 138  K 3.7 3.4* 4.2  CL 100 105 114*  CO2 22 20* 20*  GLUCOSE 171* 93 151*  BUN 13 12 10   CREATININE 1.04 0.97 0.78  CALCIUM 8.5* 7.9* 6.8*   GFR: Estimated Creatinine Clearance: 91.8 mL/min (by C-G formula based on SCr of 0.78 mg/dL). Liver Function Tests: Recent Labs  Lab 06/11/19 1545 06/12/19 1643 06/13/19 0315  AST 23 22 20   ALT 28 25 21   ALKPHOS 116 112 91  BILITOT 1.6* 2.5* 1.6*  PROT 8.3* 8.2* 6.8  ALBUMIN 4.2 4.0 3.3*   Recent Labs  Lab 06/11/19 1545  LIPASE 31   No results for input(s): AMMONIA in the last 168 hours. Coagulation Profile: Recent Labs  Lab 06/11/19 1545 06/12/19 1643  INR 1.0 1.2   Cardiac Enzymes: No results for input(s): CKTOTAL, CKMB, CKMBINDEX, TROPONINI in the last 168 hours. BNP (last 3 results) No results for input(s): PROBNP in the last 8760 hours. HbA1C: No results for input(s): HGBA1C in the last 72 hours. CBG: No results for input(s): GLUCAP in the last 168 hours. Lipid Profile: No results for input(s): CHOL, HDL, LDLCALC, TRIG, CHOLHDL, LDLDIRECT in the last 72 hours. Thyroid Function Tests: No results for  input(s): TSH, T4TOTAL, FREET4, T3FREE, THYROIDAB in the last 72 hours. Anemia Panel: No results for input(s): VITAMINB12, FOLATE, FERRITIN, TIBC, IRON, RETICCTPCT in the last 72 hours. Urine analysis:    Component Value Date/Time   COLORURINE YELLOW 06/12/2019 1750   APPEARANCEUR CLEAR 06/12/2019 1750   LABSPEC 1.006 06/12/2019 1750   PHURINE 6.0 06/12/2019 1750   GLUCOSEU NEGATIVE 06/12/2019 1750   HGBUR NEGATIVE 06/12/2019 1750   BILIRUBINUR NEGATIVE 06/12/2019 1750   KETONESUR 5 (A) 06/12/2019 1750   PROTEINUR NEGATIVE 06/12/2019 1750   UROBILINOGEN 0.2 11/08/2014 2031   NITRITE NEGATIVE 06/12/2019 1750   LEUKOCYTESUR NEGATIVE 06/12/2019 1750   Sepsis Labs: @LABRCNTIP (procalcitonin:4,lacticidven:4)  ) Recent Results (from the past 240 hour(s))  Blood Culture (routine x 2)     Status: Abnormal (Preliminary result)   Collection Time: 06/11/19  3:30 PM   Specimen: BLOOD  Result Value Ref Range Status   Specimen Description   Final    BLOOD RIGHT ANTECUBITAL Performed at [facility], [address] [place], [place] [address]    Special Requests   Final    BOTTLES DRAWN AEROBIC AND ANAEROBIC Blood Culture adequate volume Performed at [facility], [address] [place], [place] [address]    Culture  Setup Time   Final    GRAM NEGATIVE RODS IN BOTH AEROBIC AND ANAEROBIC BOTTLES CRITICAL RESULT CALLED TO, READ BACK BY AND VERIFIED WITH: Deneise Lever RN 10:25 06/11/29 ([email or web address])    Culture (A)  Final    ENTEROBACTER CLOACAE SUSCEPTIBILITIES TO FOLLOW Performed at [facility], [address] [place], [place] [address]    Report Status PENDING  Incomplete  Blood Culture ID Panel (Reflexed)     Status: Abnormal   Collection Time: 06/11/19  3:30 PM  Result Value Ref Range Status   Enterococcus species NOT DETECTED NOT DETECTED Final   Listeria monocytogenes NOT DETECTED NOT DETECTED Final   Staphylococcus species NOT DETECTED NOT DETECTED Final    Staphylococcus aureus (BCID) NOT DETECTED NOT DETECTED Final   Streptococcus species NOT DETECTED NOT DETECTED Final   Streptococcus agalactiae NOT DETECTED NOT DETECTED Final   Streptococcus pneumoniae NOT DETECTED NOT DETECTED Final   Streptococcus pyogenes NOT DETECTED NOT DETECTED Final   Acinetobacter baumannii NOT DETECTED NOT DETECTED Final   Enterobacteriaceae species DETECTED (A) NOT DETECTED Final    Comment: Enterobacteriaceae represent a large family of gram-negative bacteria, not a single organism. CRITICAL RESULT CALLED TO, READ BACK BY AND VERIFIED WITH: Deneise Lever RN 10:25 06/11/29 ([email or web address])    Enterobacter cloacae complex DETECTED (A) NOT DETECTED Final    Comment: CRITICAL RESULT CALLED TO, READ BACK BY AND VERIFIED WITH: Deneise Lever RN 10:25 06/11/29 ([email or web address])    Escherichia coli NOT DETECTED NOT DETECTED Final   Klebsiella oxytoca NOT DETECTED NOT DETECTED Final   Klebsiella pneumoniae NOT DETECTED NOT DETECTED Final   Proteus species NOT DETECTED NOT DETECTED Final   Serratia marcescens NOT DETECTED NOT  DETECTED Final   Carbapenem resistance NOT DETECTED NOT DETECTED Final   Haemophilus influenzae NOT DETECTED NOT DETECTED Final   Neisseria meningitidis NOT DETECTED NOT DETECTED Final   Pseudomonas aeruginosa NOT DETECTED NOT DETECTED Final   Candida albicans NOT DETECTED NOT DETECTED Final   Candida glabrata NOT DETECTED NOT DETECTED Final   Candida krusei NOT DETECTED NOT DETECTED Final   Candida parapsilosis NOT DETECTED NOT DETECTED Final   Candida tropicalis NOT DETECTED NOT DETECTED Final    Comment: Performed at [facility], [address], [place], [place] [address]  Blood Culture (routine x 2)     Status: None (Preliminary result)   Collection Time: 06/11/19  3:50 PM   Specimen: BLOOD  Result Value Ref Range Status   Specimen Description   Final    BLOOD LEFT ANTECUBITAL Performed at [facility], [address] [place], [place] [address]     Special Requests   Final    Blood Culture adequate volume BOTTLES DRAWN AEROBIC AND ANAEROBIC Performed at [facility], [address] [place], [place] [address]    Culture  Setup Time   Final    GRAM NEGATIVE RODS ANAEROBIC BOTTLE ONLY CRITICAL VALUE NOTED.  VALUE IS CONSISTENT WITH PREVIOUSLY REPORTED AND CALLED VALUE. Performed at [facility], [address], [place], [place] [address]    Culture GRAM NEGATIVE RODS  Final   Report Status PENDING  Incomplete  SARS Coronavirus 2 [facility] order, Performed in [facility] hospital lab) Nasopharyngeal Nasopharyngeal Swab     Status: None   Collection Time: 06/11/19  3:51 PM   Specimen: Nasopharyngeal Swab  Result Value Ref Range Status   SARS Coronavirus 2 NEGATIVE NEGATIVE Final    Comment: (NOTE) If result is NEGATIVE SARS-CoV-2 target nucleic acids are NOT DETECTED. The SARS-CoV-2 RNA is generally detectable in upper and lower  respiratory specimens during the acute phase of infection. The lowest  concentration of SARS-CoV-2 viral copies this assay can detect is 250  copies / mL. A negative result does not preclude SARS-CoV-2 infection  and should not be used as the sole basis for treatment or other  patient management decisions.  A negative result may occur with  improper specimen collection / handling, submission of specimen other  than nasopharyngeal swab, presence of viral mutation(s) within the  areas targeted by this assay, and inadequate number of viral copies  (<250 copies / mL). A negative result must be combined with clinical  observations, patient history, and epidemiological information. If result is POSITIVE SARS-CoV-2 target nucleic acids are DETECTED. The SARS-CoV-2 RNA is generally detectable in upper and lower  respiratory specimens dur ing the acute phase of infection.  Positive  results are indicative of active infection with SARS-CoV-2.  Clinical  correlation with patient history and other  diagnostic information is  necessary to determine patient infection status.  Positive results do  not rule out bacterial infection or co-infection with other viruses. If result is PRESUMPTIVE POSTIVE SARS-CoV-2 nucleic acids MAY BE PRESENT.   A presumptive positive result was obtained on the submitted specimen  and confirmed on repeat testing.  While 2019 novel coronavirus  (SARS-CoV-2) nucleic acids may be present in the submitted sample  additional confirmatory testing may be necessary for epidemiological  and / or clinical management purposes  to differentiate between  SARS-CoV-2 and other Sarbecovirus currently known to infect humans.  If clinically indicated additional testing with an alternate  test  methodology [phone] is advised. The SARS-CoV-2 RNA is generally  detectable in upper and lower respiratory sp ecimens during the acute  phase of infection. The expected result is Negative. Fact Sheet for Patients:  BoilerBrush.com.cyhttps://www.fda.gov/media/136312/download Fact Sheet for Healthcare Providers: https://pope.com/https://www.fda.gov/media/136313/download This test is not yet approved or cleared by the [place] FDA and has been authorized for detection and/or diagnosis of SARS-CoV-2 by FDA under an Emergency Use Authorization (EUA).  This EUA will remain in effect (meaning this test can be used) for the duration of the COVID-19 declaration under Section 564(b)(1) of the Act, 21 U.S.C. section 360bbb-3(b)(1), unless the authorization is terminated or revoked sooner. Performed at [facility] [place] [facility], [address] [place], [place] [address]   Urine culture     Status: None   Collection Time: 06/11/19  4:45 PM   Specimen: In/Out Cath Urine  Result Value Ref Range Status   Specimen Description   Final    IN/OUT CATH URINE Performed at [facility], [address] [place], [place] [address]    Special Requests   Final    NONE Performed at [facility], [address] [place], [place] [address]    Culture   Final    NO GROWTH Performed at [facility], [address] [place], [place] [address]    Report Status 06/12/2019 FINAL  Final  Blood Culture (routine x 2)     Status: None (Preliminary result)   Collection Time: 06/12/19  5:45 PM   Specimen: BLOOD RIGHT FOREARM  Result Value Ref Range Status   Specimen Description   Final    BLOOD RIGHT FOREARM Performed at [facility], [address] [place], [place] [address]    Special Requests   Final    BOTTLES DRAWN AEROBIC AND ANAEROBIC Blood Culture results may not be optimal due to an excessive volume of blood received in culture bottles Performed at [facility], [address] [place], [place] [address]    Culture   Final    NO GROWTH < 24 HOURS Performed at [facility], [address] [place], [place] [address]    Report Status PENDING  Incomplete  Blood Culture (routine x 2)     Status: None (Preliminary result)   Collection Time: 06/12/19  5:50 PM   Specimen: BLOOD  Result Value Ref Range Status   Specimen Description   Final    BLOOD LEFT ANTECUBITAL Performed at [facility], [address] [place], [place] [address]    Special Requests   Final    BOTTLES DRAWN AEROBIC AND ANAEROBIC Blood Culture results may not be optimal due to an excessive volume of blood received in culture bottles Performed at [facility], [address] [place], [place] [address]    Culture   Final    NO GROWTH < 24 HOURS Performed at [facility], [address] [place], [place] [address]    Report Status PENDING  Incomplete         Radiology Studies: Ct Abdomen Pelvis W Contrast  Result Date: 06/11/2019 CLINICAL DATA:  Abdominal pain with diverticulitis suspected. EXAM: CT ABDOMEN AND PELVIS WITH CONTRAST TECHNIQUE: Multidetector CT imaging of the abdomen and pelvis was performed using the  standard protocol following bolus administration of intravenous contrast. CONTRAST:  100mL OMNIPAQUE IOHEXOL 300 MG/ML  SOLN COMPARISON:  September 29, 2007. FINDINGS:  Lower chest: The lung bases are clear. The heart size is normal. Hepatobiliary: There is intrahepatic biliary ductal dilatation involving the left hepatic lobe. This appears similar to prior CT in 2008. There is persistent pneumobilia, similar to prior CT. Pancreas: Normal contours without ductal dilatation. No peripancreatic fluid collection. Spleen: No splenic laceration or hematoma. Adrenals/Urinary Tract: --Adrenal glands: No adrenal hemorrhage. --Right kidney/ureter: No hydronephrosis or perinephric hematoma. --Left kidney/ureter: No hydronephrosis or perinephric hematoma. --Urinary bladder: There is some mild bladder wall thickening. Stomach/Bowel: --Stomach/Duodenum: No hiatal hernia or other gastric abnormality. Normal duodenal course and caliber. 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 --Colon: No focal abnormality. --Appendix: Normal. Vascular/Lymphatic: Normal course and caliber of the major abdominal vessels. --No retroperitoneal lymphadenopathy. --No mesenteric lymphadenopathy. --No pelvic or inguinal lymphadenopathy. Reproductive: Unremarkable Other: No ascites or free air. 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 Musculoskeletal. No acute displaced fractures. IMPRESSION: 1. No acute abdominopelvic process. 2. Mild bladder wall thickening. Correlate with urinalysis for possible cystitis. 3. Additional chronic findings as detailed above. Electronically Signed   By: Katherine Mantlehristopher  Green M.D.   On: 06/11/2019 18:30        Scheduled Meds: . enoxaparin (LOVENOX) injection  40 mg Subcutaneous Q24H   Continuous Infusions: . levofloxacin (LEVAQUIN) IV Stopped (06/12/19 1937)     LOS: 1 day    Time spent: 25 Minutes.   Berton MountSylvester Junius Faucett, MD  [facility] Pager #:  [phone] 7PM-7AM contact night coverage as above

## 2019-06-14 LAB — URINE CULTURE: Culture: NO GROWTH

## 2019-06-14 LAB — CULTURE, BLOOD (ROUTINE X 2)
Special Requests: ADEQUATE
Special Requests: ADEQUATE

## 2019-06-14 MED ORDER — CIPROFLOXACIN HCL 500 MG PO TABS: 500.0000 mg | ORAL_TABLET | Freq: Two times a day (BID) | ORAL | 0 refills | Status: AC

## 2019-06-14 NOTE — Progress Notes (Signed)
Nurse reviewed discharge instructions with pt. Pt verbalized understanding of discharge instructions, follow up appointment and new medications.  All questions answered prior to discharge. °

## 2019-06-14 NOTE — Discharge Summary (Signed)
Physician Discharge Summary  Patient ID: Alejandro Macdonald MRN: [id number] DOB/AGE: [birth year] 53 y.o.  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  Patient presented to the hospital with abdominal pain, fever and chills, with blood culture growing Enterobacter cloacae.  Patient was treated with IV Levaquin, and will be discharged back home on oral Cipro to complete course of antibiotics.  Patient symptoms have resolved.  Patient is eager to be discharged back home.  Patient will follow with primary care provider on discharge.  Consults: None  Significant Diagnostic Studies: Blood culture grew Enterobacter cloacae  Treatments: IV Levaquin.  Patient will be discharged on oral Cipro to complete course of antibiotics.  Discharge Exam: Blood pressure (!) 128/93, pulse 74, temperature 98.9 F (37.2 C), temperature source Oral, resp. rate 18, height 5\' 5"  (1.651 m), weight 68 kg, SpO2 100 %.  Disposition: Discharge disposition: 01-Home or Self Care   Discharge Instructions    Diet - low sodium heart healthy   Complete by: As directed    Increase activity slowly   Complete by: As directed      Allergies as of 06/14/2019      Reactions   Penicillins Anaphylaxis      Medication List    TAKE these medications   acetaminophen 500 MG tablet Commonly known as: TYLENOL Take 500 mg by mouth every 6 (six) hours as needed (shivering).   ciprofloxacin 500 MG tablet Commonly known as: CIPRO Take 1 tablet (500 mg total) by mouth 2 (two) times daily for 10 days.        SignedBonnell Public 06/14/2019, 12:12 PM

## 2019-06-17 LAB — CULTURE, BLOOD (ROUTINE X 2)
Culture: NO GROWTH
Culture: NO GROWTH

## 2020-03-18 IMAGING — DX DG CHEST 1V PORT
1 series · 1 of 1 positions shown · non-contrast
Comparison: 11/08/2014.

CLINICAL DATA: 52-year-old with one-week history of fever and
abdominal pain. Nausea and vomiting that started today.

EXAM:
PORTABLE CHEST 1 VIEW

[chest ap]
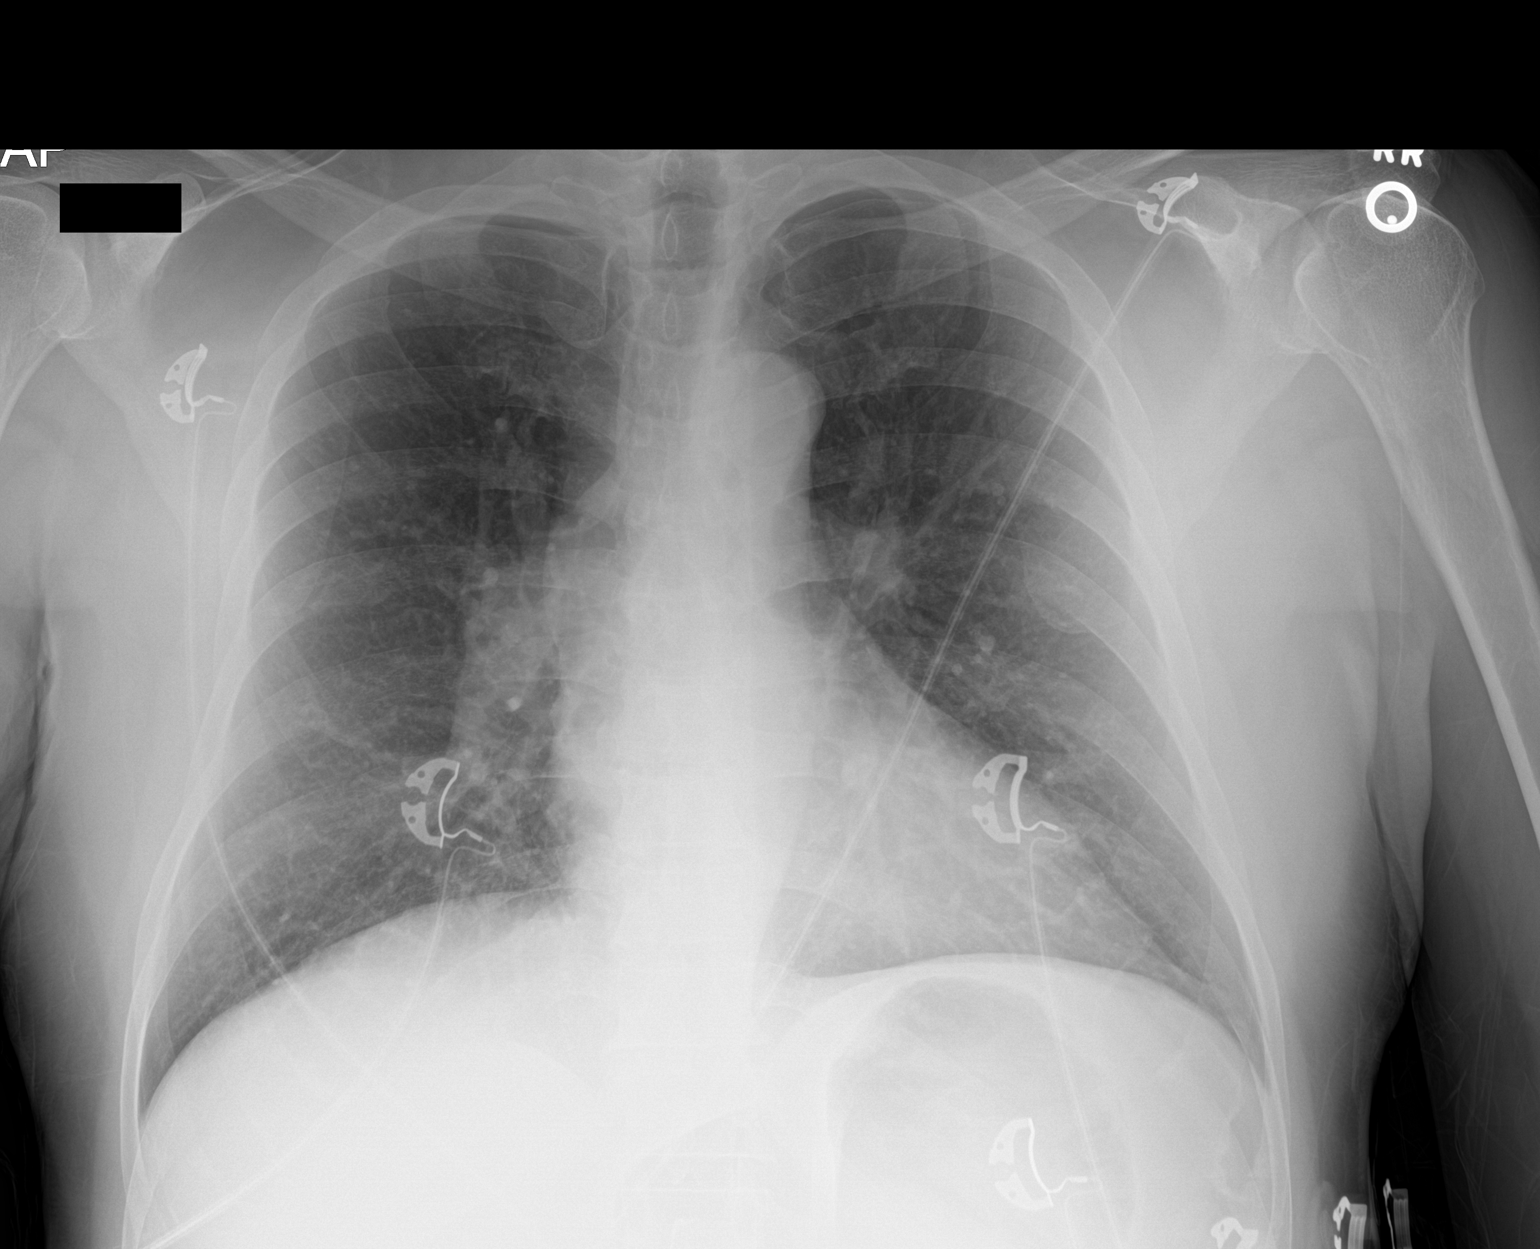

[1 of 1 positions shown; findings below may reference images not displayed]

FINDINGS: Cardiac silhouette normal in size, unchanged. Thoracic aorta
minimally tortuous. Hilar and mediastinal contours otherwise
unremarkable. Lungs clear. Bronchovascular markings normal.
Pulmonary vascularity normal. No visible pleural effusions. No
pneumothorax.
IMPRESSION: No acute cardiopulmonary disease.

## 2020-03-18 IMAGING — CT CT ABD-PELV W/ CM
2 of 5 series · 15 of 46 positions shown, 17 images · IV contrast (APPLIED)
Comparison: September 29, 2007.

CLINICAL DATA: Abdominal pain with diverticulitis suspected.

EXAM:
CT ABDOMEN AND PELVIS WITH CONTRAST
TECHNIQUE: Multidetector CT imaging of the abdomen and pelvis was performed
using the standard protocol following bolus administration of
intravenous contrast.
CONTRAST:  100mL OMNIPAQUE IOHEXOL 300 MG/ML  SOLN

[Series 2: axial st · axial · 0.68mm/px · z∈[-555,-155]mm · 12 of 90 slices shown, 14 images]
[im 5/90  soft-tissue]
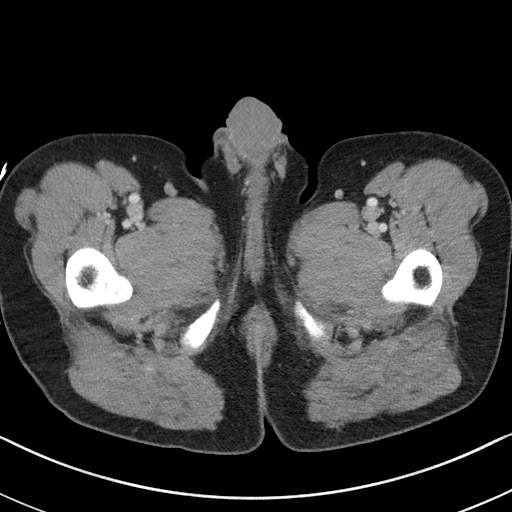
[im 5/90  bone]
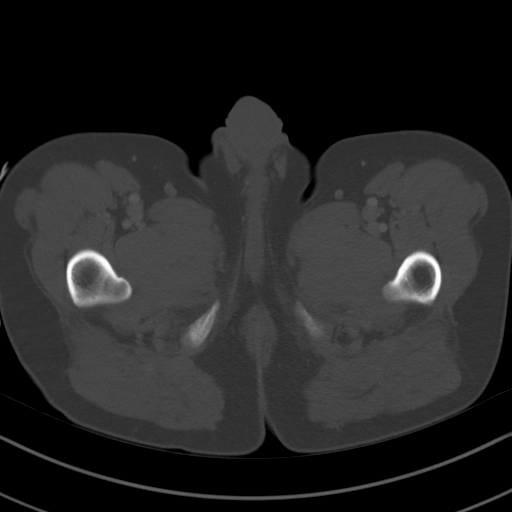
[im 15/90  soft-tissue]
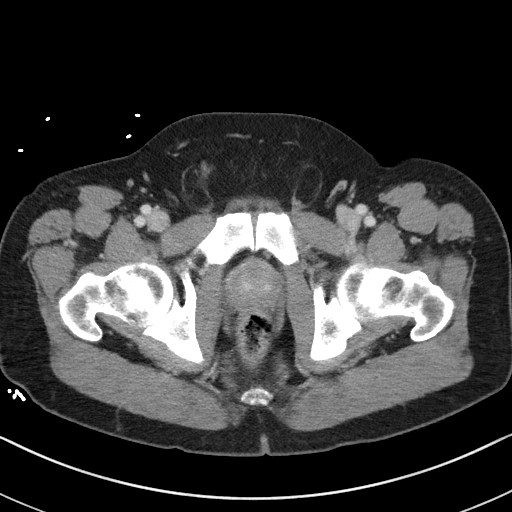
[im 20/90  soft-tissue]
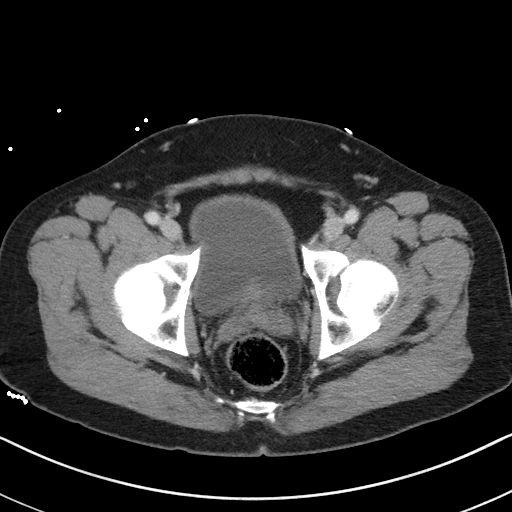
[im 25/90  soft-tissue]
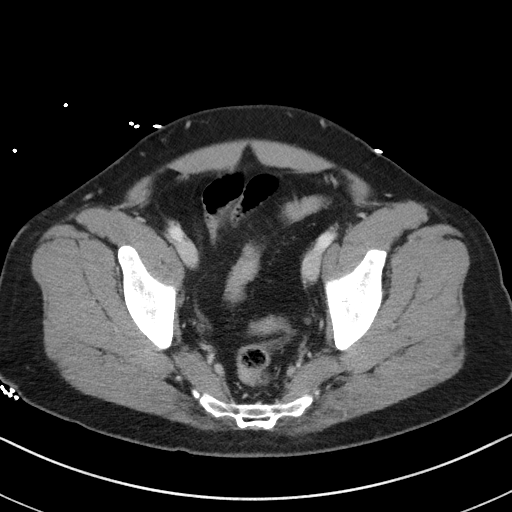
[im 35/90  soft-tissue]
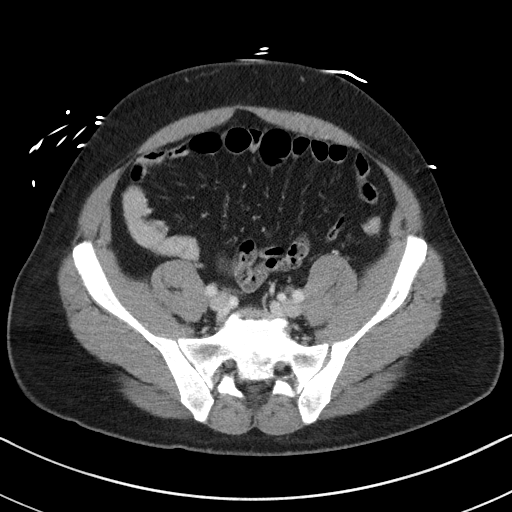
[im 40/90  soft-tissue]
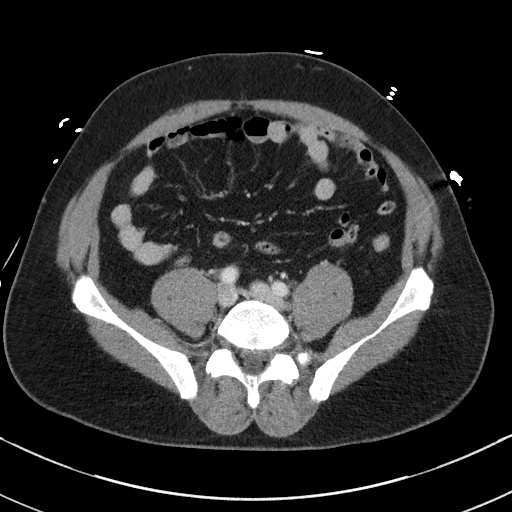
[im 50/90  soft-tissue]
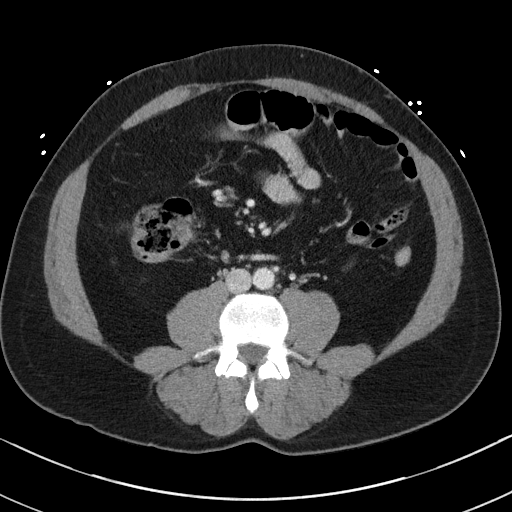
[im 55/90  soft-tissue]
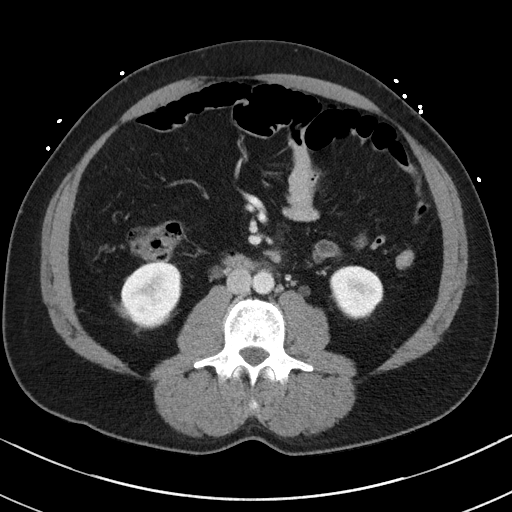
[im 65/90  soft-tissue]
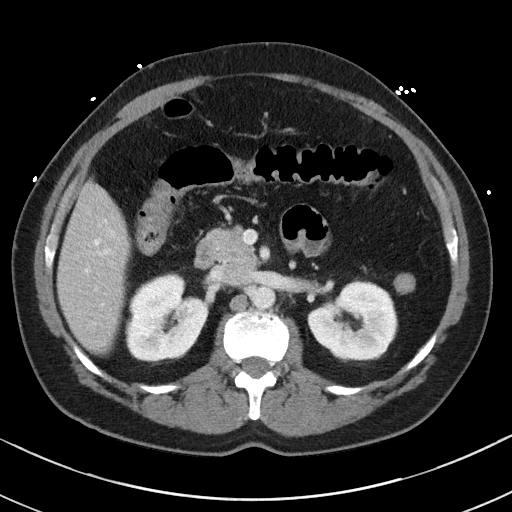
[im 65/90  bone]
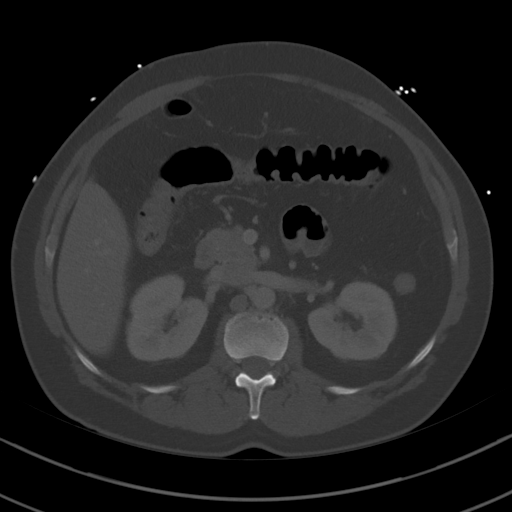
[im 70/90  soft-tissue]
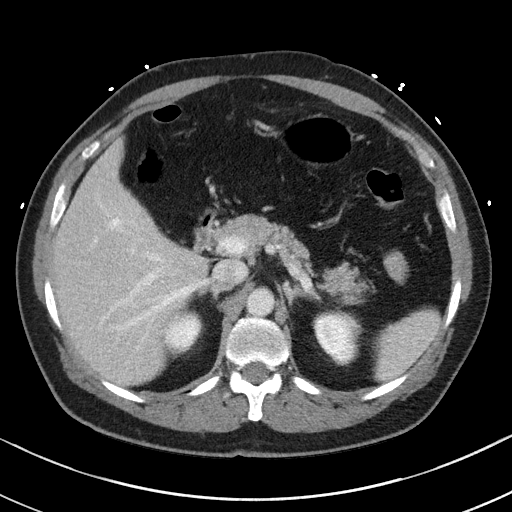
[im 75/90  soft-tissue]
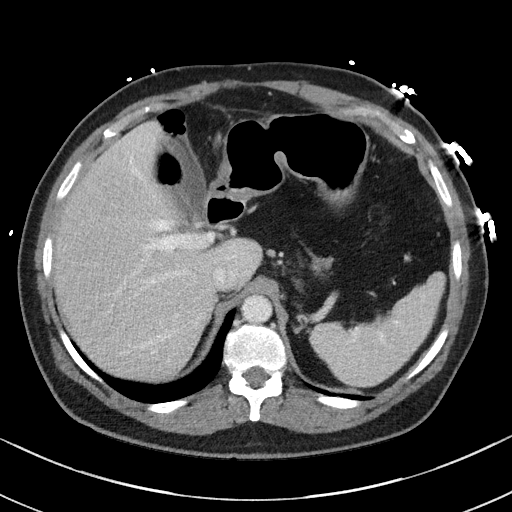
[im 85/90  soft-tissue]
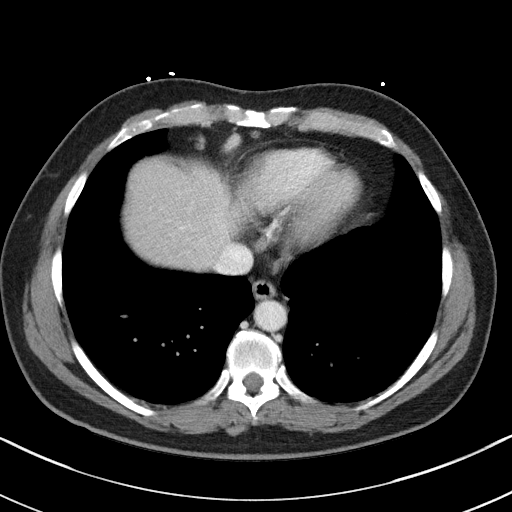

[Series 5: coronal st · coronal · 0.65mm/px · 3 of 101 slices shown]
[im 34/101  soft-tissue]
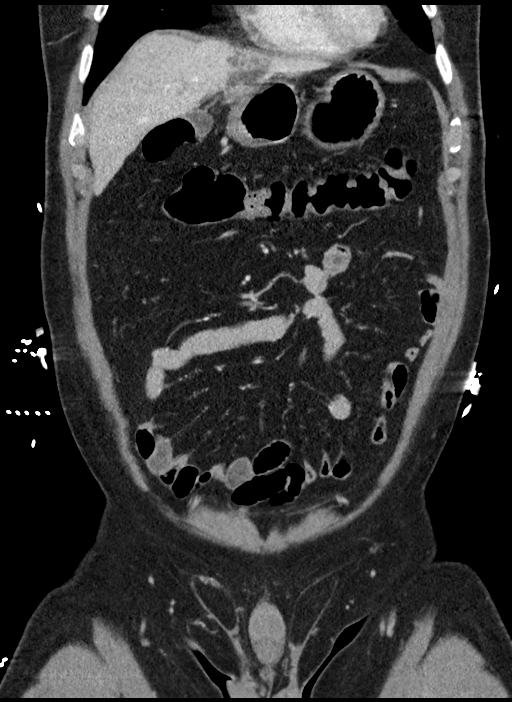
[im 45/101  soft-tissue]
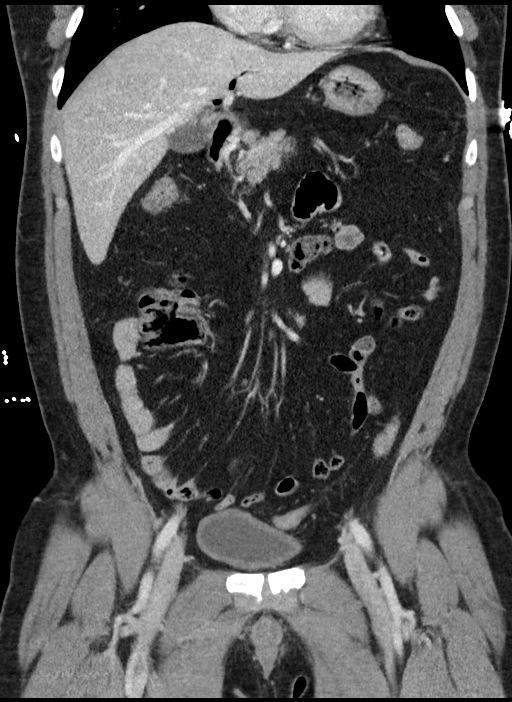
[im 56/101  soft-tissue]
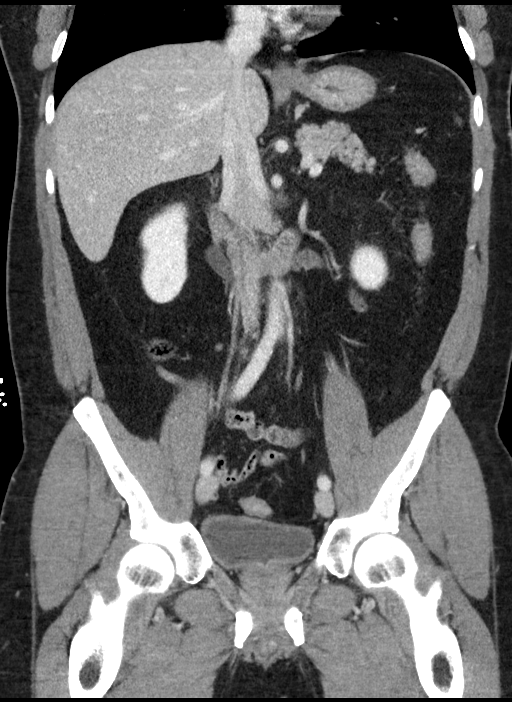

[15 of 46 positions shown; findings below may reference images not displayed]

FINDINGS: Lower chest: The lung bases are clear. The heart size is normal.

Hepatobiliary: There is intrahepatic biliary ductal dilatation
involving the left hepatic lobe. This appears similar to prior CT in
6554. There is persistent pneumobilia, similar to prior CT.

Pancreas: Normal contours without ductal dilatation. No
peripancreatic fluid collection.

Spleen: No splenic laceration or hematoma.

Adrenals/Urinary Tract:

--Adrenal glands: No adrenal hemorrhage.

--Right kidney/ureter: No hydronephrosis or perinephric hematoma.

--Left kidney/ureter: No hydronephrosis or perinephric hematoma.

--Urinary bladder: There is some mild bladder wall thickening.

Stomach/Bowel:

--Stomach/Duodenum: No hiatal hernia or other gastric abnormality.
Normal duodenal course and caliber.

--Small bowel: There are postsurgical changes suggestive of a prior
hepaticojejunostomy.

--Colon: No focal abnormality.

--Appendix: Normal.

Vascular/Lymphatic: Normal course and caliber of the major abdominal
vessels.

--No retroperitoneal lymphadenopathy.

--No mesenteric lymphadenopathy.

--No pelvic or inguinal lymphadenopathy.

Reproductive: Unremarkable

Other: No ascites or free air. There are bilateral fat containing
inguinal hernias.

Musculoskeletal. No acute displaced fractures.
IMPRESSION: 1. No acute abdominopelvic process.
2. Mild bladder wall thickening. Correlate with urinalysis for
possible cystitis.
3. Additional chronic findings as detailed above.
